# Patient Record
Sex: Female | Born: 1955 | Race: Black or African American | Hispanic: No | Marital: Single | Smoking: Current every day smoker
Health system: Southern US, Community
[De-identification: ages and names within clinical notes are randomized; demographics above are authoritative.]

## PROBLEM LIST (undated history)

## (undated) DIAGNOSIS — I10 Essential (primary) hypertension: Secondary | ICD-10-CM

## (undated) HISTORY — DX: Essential (primary) hypertension: I10

## (undated) HISTORY — PX: KNEE SURGERY: SHX244

## (undated) HISTORY — PX: TONSILLECTOMY: SUR1361

## (undated) HISTORY — PX: APPENDECTOMY: SHX54

---

## 2011-06-29 ENCOUNTER — Emergency Department (HOSPITAL_COMMUNITY): Payer: No Typology Code available for payment source

## 2011-06-29 ENCOUNTER — Emergency Department (HOSPITAL_COMMUNITY)
Admission: EM | Admit: 2011-06-29 | Discharge: 2011-06-29 | Disposition: A | Discharge status: Home / Self Care | Payer: No Typology Code available for payment source | Attending: Emergency Medicine | Admitting: Emergency Medicine

## 2011-06-29 DIAGNOSIS — T07XXXA Unspecified multiple injuries, initial encounter: Secondary | ICD-10-CM

## 2011-06-29 DIAGNOSIS — M545 Low back pain, unspecified: Secondary | ICD-10-CM | POA: Insufficient documentation

## 2011-06-29 DIAGNOSIS — M25569 Pain in unspecified knee: Secondary | ICD-10-CM | POA: Insufficient documentation

## 2011-06-29 DIAGNOSIS — F17200 Nicotine dependence, unspecified, uncomplicated: Secondary | ICD-10-CM | POA: Insufficient documentation

## 2011-06-29 DIAGNOSIS — R079 Chest pain, unspecified: Secondary | ICD-10-CM | POA: Insufficient documentation

## 2011-06-29 DIAGNOSIS — M542 Cervicalgia: Secondary | ICD-10-CM | POA: Insufficient documentation

## 2011-06-29 MED ORDER — METHOCARBAMOL 500 MG PO TABS: ORAL_TABLET | ORAL | Status: DC

## 2011-06-29 MED ORDER — HYDROCODONE-ACETAMINOPHEN 5-325 MG PO TABS
ORAL_TABLET | ORAL | Status: AC
Start: 2011-06-29 — End: 2011-07-09

## 2011-06-29 NOTE — ED Notes (Signed)
Pt called for treatment x 3 no answer.
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
## 2011-07-02 NOTE — ED Provider Notes (Signed)
Medical screening examination/treatment/procedure(s) were performed by non-physician practitioner and as supervising physician I was immediately available for consultation/collaboration.   Elwyn Lowden M Zevin Nevares, DO 07/02/11 1509 
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
## 2017-07-22 ENCOUNTER — Other Ambulatory Visit: Payer: Self-pay

## 2017-07-22 ENCOUNTER — Emergency Department (HOSPITAL_COMMUNITY): Payer: BLUE CROSS/BLUE SHIELD

## 2017-07-22 ENCOUNTER — Encounter (HOSPITAL_COMMUNITY): Payer: Self-pay | Admitting: *Deleted

## 2017-07-22 ENCOUNTER — Emergency Department (HOSPITAL_COMMUNITY)
Admission: EM | Admit: 2017-07-22 | Discharge: 2017-07-23 | Disposition: A | Discharge status: Home / Self Care | Payer: BLUE CROSS/BLUE SHIELD | Attending: Emergency Medicine | Admitting: Emergency Medicine

## 2017-07-22 DIAGNOSIS — R51 Headache: Secondary | ICD-10-CM | POA: Insufficient documentation

## 2017-07-22 DIAGNOSIS — R05 Cough: Secondary | ICD-10-CM | POA: Insufficient documentation

## 2017-07-22 DIAGNOSIS — R079 Chest pain, unspecified: Secondary | ICD-10-CM | POA: Insufficient documentation

## 2017-07-22 DIAGNOSIS — F17200 Nicotine dependence, unspecified, uncomplicated: Secondary | ICD-10-CM | POA: Diagnosis not present

## 2017-07-22 DIAGNOSIS — J029 Acute pharyngitis, unspecified: Secondary | ICD-10-CM | POA: Diagnosis present

## 2017-07-22 DIAGNOSIS — J3489 Other specified disorders of nose and nasal sinuses: Secondary | ICD-10-CM | POA: Diagnosis not present

## 2017-07-22 DIAGNOSIS — R0602 Shortness of breath: Secondary | ICD-10-CM | POA: Insufficient documentation

## 2017-07-22 DIAGNOSIS — J181 Lobar pneumonia, unspecified organism: Secondary | ICD-10-CM | POA: Diagnosis not present

## 2017-07-22 DIAGNOSIS — J189 Pneumonia, unspecified organism: Secondary | ICD-10-CM

## 2017-07-22 LAB — COMPREHENSIVE METABOLIC PANEL
ALK PHOS: 51 U/L (ref 38–126)
ALT: 19 U/L (ref 14–54)
ANION GAP: 15 (ref 5–15)
AST: 30 U/L (ref 15–41)
Albumin: 3.7 g/dL (ref 3.5–5.0)
BILIRUBIN TOTAL: 0.3 mg/dL (ref 0.3–1.2)
BUN: 15 mg/dL (ref 6–20)
CO2: 27 mmol/L (ref 22–32)
Calcium: 8.8 mg/dL — ABNORMAL LOW (ref 8.9–10.3)
Chloride: 99 mmol/L — ABNORMAL LOW (ref 101–111)
Creatinine, Ser: 0.7 mg/dL (ref 0.44–1.00)
GFR calc Af Amer: >60 mL/min (ref >60)
GFR calc non Af Amer: >60 mL/min (ref >60)
Glucose, Bld: 91 mg/dL (ref 65–99)
POTASSIUM: 4 mmol/L (ref 3.5–5.1)
SODIUM: 141 mmol/L (ref 135–145)
Total Protein: 7.7 g/dL (ref 6.5–8.1)

## 2017-07-22 LAB — URINALYSIS, ROUTINE W REFLEX MICROSCOPIC
BACTERIA UA: NONE SEEN
Bilirubin Urine: NEGATIVE
Glucose, UA: NEGATIVE mg/dL
KETONES UR: 20 mg/dL — AB
Leukocytes, UA: NEGATIVE
Nitrite: NEGATIVE
PROTEIN: 100 mg/dL — AB
Specific Gravity, Urine: 1.025 (ref 1.005–1.030)
pH: 5 (ref 5.0–8.0)

## 2017-07-22 LAB — CBC
HEMATOCRIT: 39.6 % (ref 36.0–46.0)
HEMOGLOBIN: 12.5 g/dL (ref 12.0–15.0)
MCH: 30.2 pg (ref 26.0–34.0)
MCHC: 31.6 g/dL (ref 30.0–36.0)
MCV: 95.7 fL (ref 78.0–100.0)
Platelets: 228 10*3/uL (ref 150–400)
RBC: 4.14 MIL/uL (ref 3.87–5.11)
RDW: 13.8 % (ref 11.5–15.5)
WBC: 6.8 10*3/uL (ref 4.0–10.5)

## 2017-07-22 LAB — TROPONIN I

## 2017-07-22 LAB — LIPASE, BLOOD: Lipase: 41 U/L (ref 11–51)

## 2017-07-22 MED ORDER — BENZONATATE 100 MG PO CAPS
100.0000 mg | ORAL_CAPSULE | Freq: Once | ORAL | Status: AC
  Administered 2017-07-22: 100 mg via ORAL
  Filled 2017-07-22: qty 1

## 2017-07-22 MED ORDER — DOXYCYCLINE HYCLATE 100 MG PO TABS
100.0000 mg | ORAL_TABLET | Freq: Once | ORAL | Status: AC
  Administered 2017-07-22: 100 mg via ORAL
  Filled 2017-07-22: qty 1

## 2017-07-22 MED ORDER — KETOROLAC TROMETHAMINE 60 MG/2ML IM SOLN
30.0000 mg | Freq: Once | INTRAMUSCULAR | Status: AC
  Administered 2017-07-22: 30 mg via INTRAMUSCULAR
  Filled 2017-07-22: qty 2

## 2017-07-22 MED ORDER — BENZONATATE 100 MG PO CAPS: 100.0000 mg | ORAL_CAPSULE | Freq: Three times a day (TID) | ORAL | 0 refills | Status: DC | PRN

## 2017-07-22 MED ORDER — DOXYCYCLINE HYCLATE 100 MG PO CAPS: 100.0000 mg | ORAL_CAPSULE | Freq: Two times a day (BID) | ORAL | 0 refills | Status: DC

## 2017-07-22 MED ORDER — DEXAMETHASONE 4 MG PO TABS
10.0000 mg | ORAL_TABLET | Freq: Once | ORAL | Status: AC
  Administered 2017-07-22: 10 mg via ORAL
  Filled 2017-07-22: qty 3

## 2017-07-22 NOTE — ED Triage Notes (Addendum)
Pt c/o LLQ abdominal pain x 3 weeks that radiates down bilateral legs and into back. Pt reports nausea, vomiting, diarrhea that started today.   Pt also c/o fever, body aches, headaches, sore throat, runny nose that started 07/16/17. Pt has taken Alka Seltzer Cold medicine with no relief.   Pt also c/o mid chest pain with no radiation x 2 days.

## 2017-07-23 NOTE — ED Provider Notes (Signed)
[facility]GENCY [facility]PARTMENT Provider Note   CSN: [id number] Arrival date & time: 07/22/17  1459     History   Chief Complaint Chief Complaint  Patient presents with  . Abdominal Pain  . Sore Throat    HPI Brianna Castro is a 62 y.o. female.   Sore Throat  Associated symptoms include chest pain, headaches and shortness of breath.  Cough  This is a new problem. The current episode started more than 1 week ago. The problem occurs constantly. The problem has been gradually worsening. The cough is productive of sputum. The maximum temperature recorded prior to her arrival was 100 to 100.9 F. Associated symptoms include chest pain, chills, sweats, ear congestion, headaches, rhinorrhea, sore throat, myalgias, shortness of breath and wheezing. She has tried nothing for the symptoms.    History reviewed. No pertinent past medical history.  There are no active problems to display for this patient.   Past Surgical History:  Procedure Laterality Date  . APPENDECTOMY    . KNEE SURGERY    . TONSILLECTOMY      OB History    No data available       Home Medications    Prior to Admission medications   Medication Sig Start Date End Date Taking? Authorizing Provider  benzonatate (TESSALON) 100 MG capsule Take 1 capsule (100 mg total) by mouth 3 (three) times daily as needed for cough. 07/22/17   Jonathyn Carothers, Barbara CowerJason, MD  doxycycline (VIBRAMYCIN) 100 MG capsule Take 1 capsule (100 mg total) by mouth 2 (two) times daily. One po bid x 7 days 07/22/17   Birdie Beveridge, Barbara CowerJason, MD  methocarbamol (ROBAXIN) 500 MG tablet Take 2 tabs po TID x 7 days 06/29/11   Pauline Ausriplett, Tammy, PA-C    Family History No family history on file.  Social History Social History   Tobacco Use  . Smoking status: Current Every Day Smoker    Packs/day: 0.50  . Smokeless tobacco: Never Used  Substance Use Topics  . Alcohol use: No  . Drug use: No     Allergies   Patient has no known allergies.   Review of  Systems Review of Systems  Constitutional: Positive for chills.  HENT: Positive for rhinorrhea and sore throat.   Respiratory: Positive for cough, shortness of breath and wheezing.   Cardiovascular: Positive for chest pain.  Musculoskeletal: Positive for myalgias.  Neurological: Positive for headaches.  All other systems reviewed and are negative.    Physical Exam Updated Vital Signs BP 110/72 (BP Location: Right Arm)   Pulse 72   Temp 98.6 F (37 C) (Oral)   Resp 19   Ht 5\' 2"  (1.575 m)   Wt 52.6 kg (116 lb)   SpO2 99%   BMI 21.22 kg/m   Physical Exam  Constitutional: She appears well-developed and well-nourished.  HENT:  Head: Normocephalic and atraumatic.  Eyes: EOM are normal. Pupils are equal, round, and reactive to light.  Neck: Normal range of motion.  Cardiovascular: Normal rate and regular rhythm.  Pulmonary/Chest: No stridor. Tachypnea noted. No respiratory distress. She has rales in the left lower field.  Abdominal: Normal appearance. She exhibits no distension.  Neurological: She is alert.  Skin: Skin is warm and dry.  Nursing note and vitals reviewed.    ED Treatments / Results  Labs (all labs ordered are listed, but only abnormal results are displayed) Labs Reviewed  COMPREHENSIVE METABOLIC PANEL - Abnormal; Notable for the following components:      Result  Value   Chloride 99 (*)    Calcium 8.8 (*)    All other components within normal limits  URINALYSIS, ROUTINE W REFLEX MICROSCOPIC - Abnormal; Notable for the following components:   Color, Urine AMBER (*)    APPearance HAZY (*)    Hgb urine dipstick SMALL (*)    Ketones, ur 20 (*)    Protein, ur 100 (*)    Squamous Epithelial / LPF 0-5 (*)    All other components within normal limits  LIPASE, BLOOD  CBC  TROPONIN I    EKG  EKG Interpretation None       Radiology Dg Chest 2 View  Result Date: 07/22/2017 CLINICAL DATA:  Cough and chest congestion. Chest pain and fever and shortness  of breath. EXAM: CHEST  2 VIEW COMPARISON:  06/29/2011 FINDINGS: The heart size and pulmonary vascularity are normal. There is peribronchial thickening with a small focal area of streaky atelectasis at the left base posterior medially. No lung consolidation. No effusions. No bone abnormality.  Aortic atherosclerosis. IMPRESSION: Bronchitic change with streaky atelectasis at the left lung base posterior medially. I recommend a follow-up chest x-ray in 6 weeks to ensure clearing. Electronically Signed   By: Francene Boyers M.D.   On: 07/22/2017 16:28    Procedures Procedures (including critical care time)  Medications Ordered in ED Medications  doxycycline (VIBRA-TABS) tablet 100 mg (100 mg Oral Given 07/22/17 2248)  benzonatate (TESSALON) capsule 100 mg (100 mg Oral Given 07/22/17 2249)  dexamethasone (DECADRON) tablet 10 mg (10 mg Oral Given 07/22/17 2248)  ketorolac (TORADOL) injection 30 mg (30 mg Intramuscular Given 07/22/17 2247)     Initial Impression / Assessment and Plan / ED Course  I have reviewed the triage vital signs and the nursing notes.  Pertinent labs & imaging results that were available during my care of the patient were reviewed by me and considered in my medical decision making (see chart for details).     Suspect pneumonia. Crackles in LLL and questionable consolidation on XR c/w same. With symptoms, not getting better, will start abx.   Final Clinical Impressions(s) / ED Diagnoses   Final diagnoses:  Pharyngitis, unspecified etiology  Community acquired pneumonia of left lower lobe of lung [facility])    ED Discharge Orders        Ordered    doxycycline (VIBRAMYCIN) 100 MG capsule  2 times daily     07/22/17 2353    benzonatate (TESSALON) 100 MG capsule  3 times daily PRN     07/22/17 2353       Angelmarie Ponzo, Barbara Cower, MD 07/23/17 [phone]

## 2018-11-29 IMAGING — DX DG CHEST 2V
2 series · 2 of 2 positions shown · non-contrast
Comparison: 06/29/2011

CLINICAL DATA: Cough and chest congestion. Chest pain and fever and
shortness of breath.

EXAM:
CHEST  2 VIEW

[chest pa]
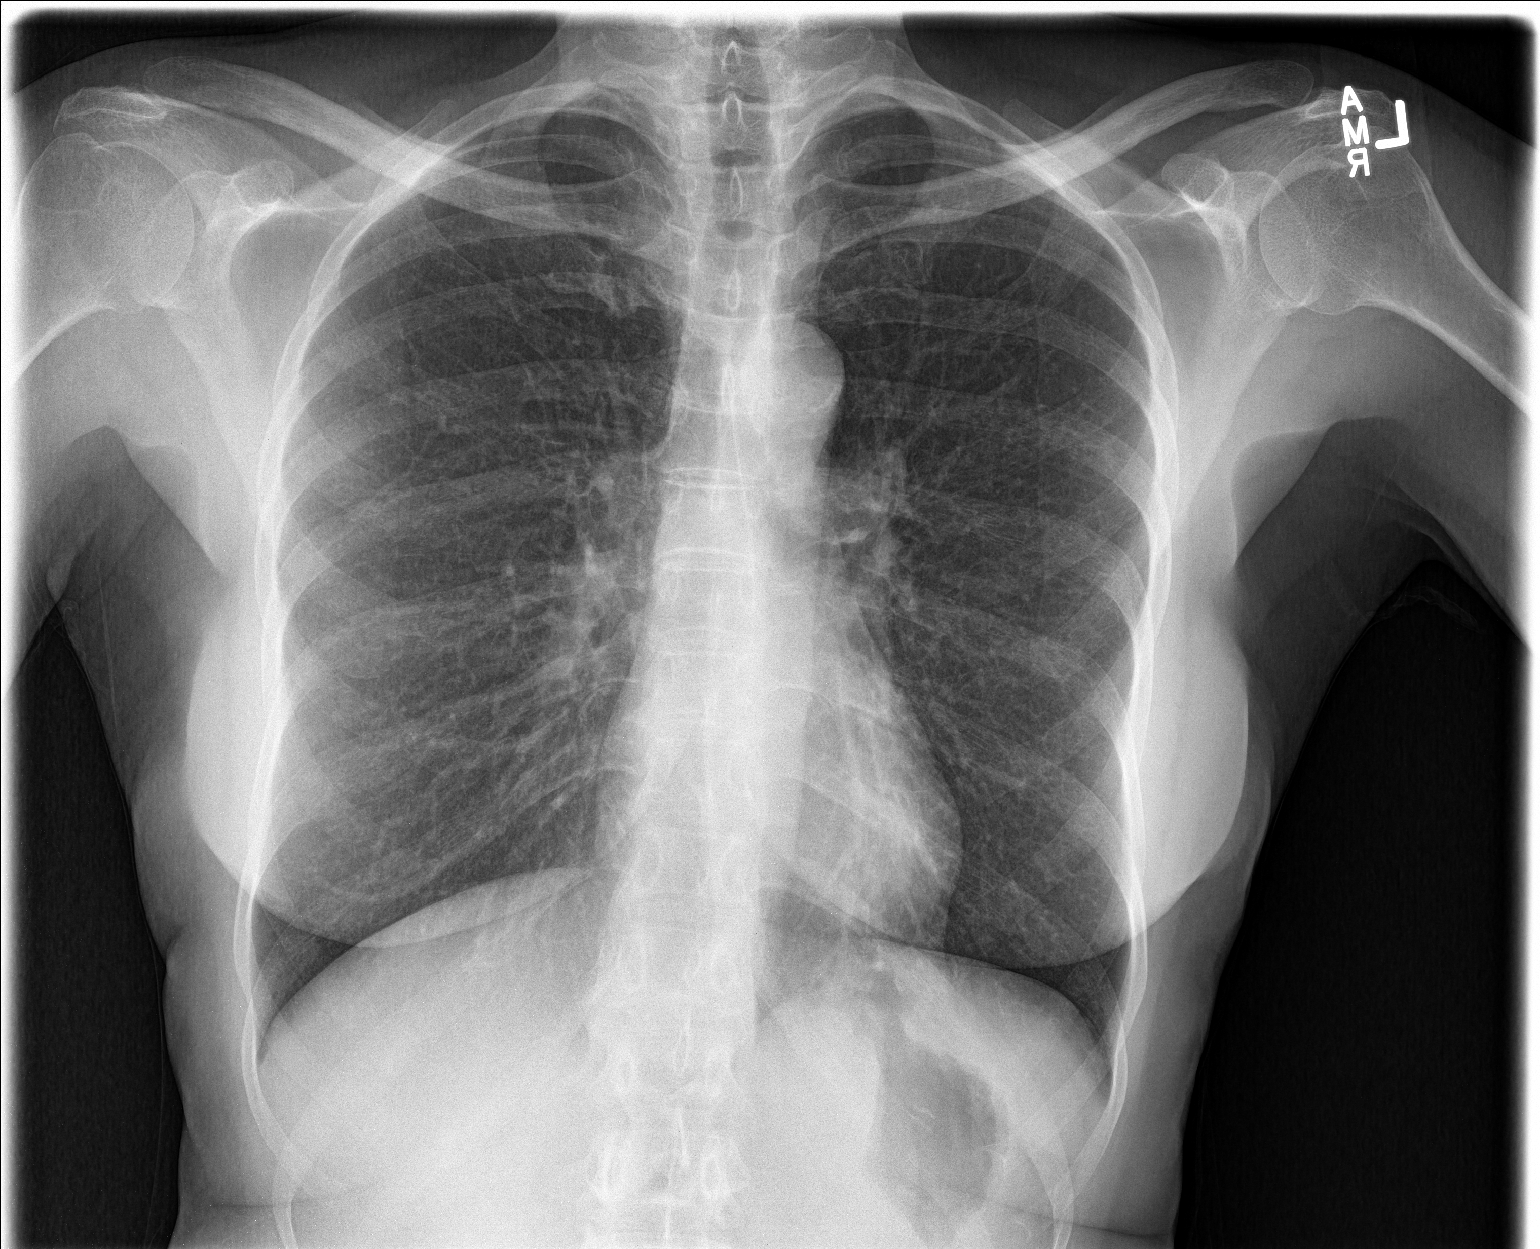

[chest lat]
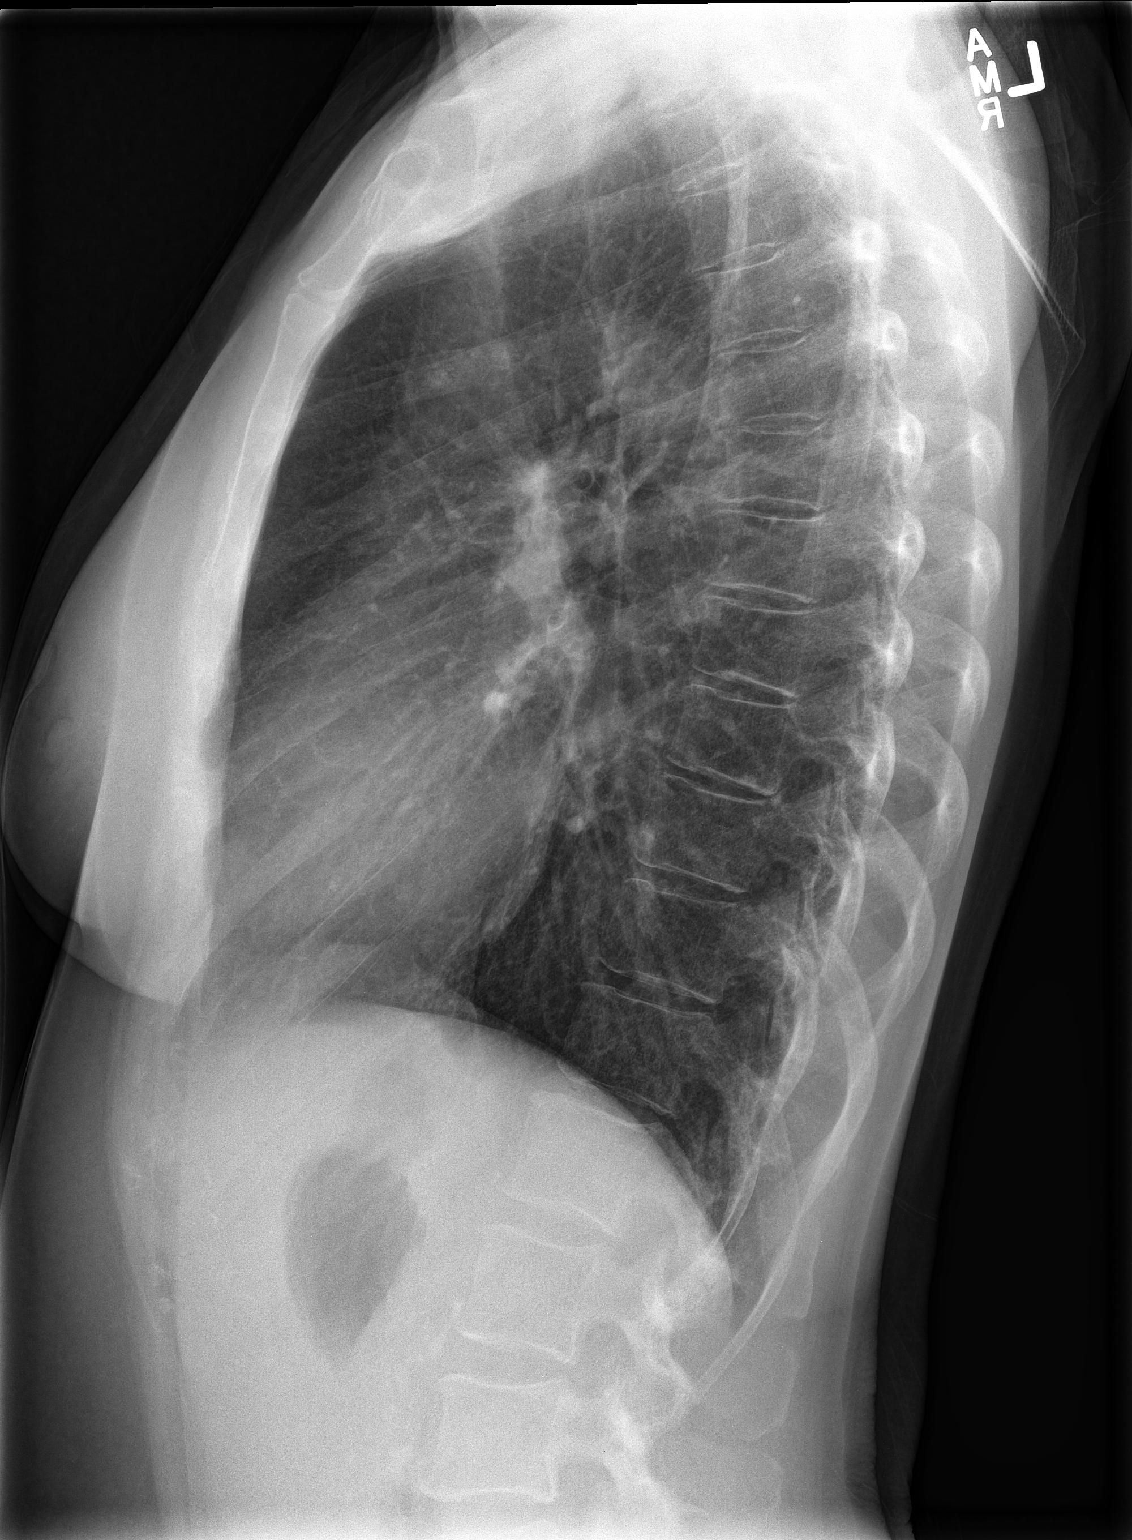

[2 of 2 positions shown; findings below may reference images not displayed]

FINDINGS: The heart size and pulmonary vascularity are normal. There is
peribronchial thickening with a small focal area of streaky
atelectasis at the left base posterior medially. No lung
consolidation. No effusions.

No bone abnormality.  Aortic atherosclerosis.
IMPRESSION: Bronchitic change with streaky atelectasis at the left lung base
posterior medially. I recommend a follow-up chest x-ray in 6 weeks
to ensure clearing.

## 2021-11-08 ENCOUNTER — Ambulatory Visit: Payer: BLUE CROSS/BLUE SHIELD | Admitting: Nurse Practitioner

## 2021-12-20 ENCOUNTER — Ambulatory Visit (INDEPENDENT_AMBULATORY_CARE_PROVIDER_SITE_OTHER): Payer: Medicare Other | Admitting: Family Medicine

## 2021-12-20 ENCOUNTER — Encounter: Payer: Self-pay | Admitting: Family Medicine

## 2021-12-20 VITALS — BP 132/80 | HR 76 | Ht 62.0 in | Wt 115.8 lb

## 2021-12-20 DIAGNOSIS — Z114 Encounter for screening for human immunodeficiency virus [HIV]: Secondary | ICD-10-CM

## 2021-12-20 DIAGNOSIS — Z0001 Encounter for general adult medical examination with abnormal findings: Secondary | ICD-10-CM

## 2021-12-20 DIAGNOSIS — Z1159 Encounter for screening for other viral diseases: Secondary | ICD-10-CM | POA: Diagnosis not present

## 2021-12-20 DIAGNOSIS — E559 Vitamin D deficiency, unspecified: Secondary | ICD-10-CM | POA: Diagnosis not present

## 2021-12-20 DIAGNOSIS — R7301 Impaired fasting glucose: Secondary | ICD-10-CM | POA: Diagnosis not present

## 2021-12-20 DIAGNOSIS — Z1211 Encounter for screening for malignant neoplasm of colon: Secondary | ICD-10-CM

## 2021-12-20 DIAGNOSIS — Z13820 Encounter for screening for osteoporosis: Secondary | ICD-10-CM | POA: Diagnosis not present

## 2021-12-20 DIAGNOSIS — Z23 Encounter for immunization: Secondary | ICD-10-CM | POA: Diagnosis not present

## 2021-12-20 DIAGNOSIS — Z1231 Encounter for screening mammogram for malignant neoplasm of breast: Secondary | ICD-10-CM | POA: Diagnosis not present

## 2021-12-20 NOTE — Progress Notes (Signed)
pa

## 2021-12-20 NOTE — Assessment & Plan Note (Signed)
-   physical exam performed -pending labs -mammogram ordered -dexa scan ordered

## 2021-12-20 NOTE — Patient Instructions (Addendum)
I appreciate the opportunity to provide care to you today!    Follow up:  4 months  Labs: please stop by the lab today to get your blood drawn (CBC, CMP, TSH, Lipid profile, HgA1c, Vit D)  Screening: HIV and Hep C  Please stop by [facility] hospital anytime to get an x-ray of your bones to assess for osteoporosis  and your mammogram  Thank you for getting your Tdap and Shingles vaccine    Please continue to a heart-healthy diet and increase your physical activities. Try to exercise for at least three times a week.      It was a pleasure to see you and I look forward to continuing to work together on your health and well-being. Please do not hesitate to call the office if you need care or have questions about your care.   Have a wonderful day and week. With Gratitude, Gilmore Laroche MSN, FNP-BC

## 2021-12-20 NOTE — Progress Notes (Signed)
o  New Patient Office Visit  Subjective:  Patient ID: Brianna Castro, female    DOB: 12-Oct-1955  Age: 66 y.o. MRN: [id number]  CC:  Chief Complaint  Patient presents with   New Patient (Initial Visit)    Pt establishing care, never had a PCP. Due for pap smear.     HPI Brianna Castro is a 66 y.o. female with  presents for establishing care. She has no concerns or complaints.  History reviewed. No pertinent past medical history.  Past Surgical History:  Procedure Laterality Date   APPENDECTOMY     KNEE SURGERY     TONSILLECTOMY      History reviewed. No pertinent family history.  Social History   Socioeconomic History   Marital status: Married    Spouse name: Not on file   Number of children: Not on file   Years of education: Not on file   Highest education level: Not on file  Occupational History   Not on file  Tobacco Use   Smoking status: Every Day    Packs/day: 0.50    Types: Cigarettes   Smokeless tobacco: Never  Substance and Sexual Activity   Alcohol use: No   Drug use: No   Sexual activity: Not on file  Other Topics Concern   Not on file  Social History Narrative   Not on file   Social Determinants of Health   Financial Resource Strain: Not on file  Food Insecurity: Not on file  Transportation Needs: Not on file  Physical Activity: Not on file  Stress: Not on file  Social Connections: Not on file  Intimate Partner Violence: Not on file    ROS Review of Systems  Constitutional:  Negative for chills, fatigue and fever.  HENT:  Negative for congestion, rhinorrhea, sinus pressure, sinus pain, sneezing and sore throat.   Eyes:  Negative for pain, redness and itching.  Respiratory:  Negative for chest tightness, shortness of breath and wheezing.   Cardiovascular:  Negative for chest pain, palpitations and leg swelling.  Gastrointestinal:  Negative for constipation, diarrhea, nausea and vomiting.  Endocrine: Negative for polydipsia, polyphagia and  polyuria.  Genitourinary:  Negative for dysuria, enuresis and urgency.  Musculoskeletal:  Negative for back pain and neck pain.  Skin:  Negative for rash and wound.  Neurological:  Negative for dizziness, tremors, weakness and numbness.  Psychiatric/Behavioral:  Negative for confusion, self-injury, sleep disturbance and suicidal ideas.    Objective:   Today's Vitals: BP 132/80   Pulse 76   Ht 5' 2"  (1.575 m)   Wt 115 lb 12.8 oz (52.5 kg)   SpO2 98%   BMI 21.18 kg/m   Physical Exam HENT:     Head: Normocephalic.     Left Ear: External ear normal.     Nose: No congestion.     Mouth/Throat:     Mouth: Mucous membranes are moist.     Dentition: Has dentures.  Eyes:     Extraocular Movements: Extraocular movements intact.     Pupils: Pupils are equal, round, and reactive to light.  Cardiovascular:     Rate and Rhythm: Normal rate and regular rhythm.     Pulses: Normal pulses.     Heart sounds: Normal heart sounds.  Pulmonary:     Effort: Pulmonary effort is normal.     Breath sounds: Normal breath sounds.  Abdominal:     Palpations: Abdomen is soft.  Musculoskeletal:     Cervical back: No rigidity.  Right lower leg: No edema.     Left lower leg: No edema.  Skin:    Findings: No lesion or rash.  Neurological:     Mental Status: She is alert and oriented to person, place, and time.     Comments: Normal affect    Assessment & Plan:   Problem List Items Addressed This Visit       Other   Encounter for general adult medical examination with abnormal findings    - physical exam performed -pending labs -mammogram ordered -dexa scan ordered       Relevant Orders   CBC with Differential/Platelet   CMP14+EGFR   Lipid panel   TSH + free T4   Other Visit Diagnoses     Colon cancer screening    -  Primary   Relevant Orders   Cologuard   Breast cancer screening by mammogram       Relevant Orders   MM 3D SCREEN BREAST BILATERAL   Need for shingles vaccine        Relevant Orders   Varicella-zoster vaccine IM (Shingrix) (Completed)   Need for Tdap vaccination       Relevant Orders   Tdap vaccine greater than or equal to 7yo IM (Completed)   Osteoporosis screening       Relevant Orders   DG Bone Density   Need for hepatitis C screening test       Relevant Orders   Hepatitis C antibody   Encounter for screening for HIV       Relevant Orders   HIV antibody (with reflex)   Vitamin D deficiency       Relevant Orders   Vitamin D (25 hydroxy)   IFG (impaired fasting glucose)       Relevant Orders   Hemoglobin A1C       Outpatient Encounter Medications as of 12/20/2021  Medication Sig   benzonatate (TESSALON) 100 MG capsule Take 1 capsule (100 mg total) by mouth 3 (three) times daily as needed for cough. (Patient not taking: Reported on 12/20/2021)   doxycycline (VIBRAMYCIN) 100 MG capsule Take 1 capsule (100 mg total) by mouth 2 (two) times daily. One po bid x 7 days (Patient not taking: Reported on 12/20/2021)   methocarbamol (ROBAXIN) 500 MG tablet Take 2 tabs po TID x 7 days (Patient not taking: Reported on 12/20/2021)   No facility-administered encounter medications on file as of 12/20/2021.    Follow-up: Return in about 4 months (around 04/21/2022).   Alvira Monday, FNP

## 2021-12-26 ENCOUNTER — Ambulatory Visit (HOSPITAL_COMMUNITY)
Admission: RE | Admit: 2021-12-26 | Discharge: 2021-12-26 | Disposition: A | Discharge status: Home / Self Care | Payer: Medicare Other | Source: Ambulatory Visit | Attending: Family Medicine | Admitting: Family Medicine

## 2021-12-26 DIAGNOSIS — M8589 Other specified disorders of bone density and structure, multiple sites: Secondary | ICD-10-CM | POA: Insufficient documentation

## 2021-12-26 DIAGNOSIS — R7301 Impaired fasting glucose: Secondary | ICD-10-CM | POA: Diagnosis not present

## 2021-12-26 DIAGNOSIS — Z0001 Encounter for general adult medical examination with abnormal findings: Secondary | ICD-10-CM | POA: Diagnosis not present

## 2021-12-26 DIAGNOSIS — F17200 Nicotine dependence, unspecified, uncomplicated: Secondary | ICD-10-CM | POA: Diagnosis not present

## 2021-12-26 DIAGNOSIS — Z1159 Encounter for screening for other viral diseases: Secondary | ICD-10-CM | POA: Diagnosis not present

## 2021-12-26 DIAGNOSIS — Z1231 Encounter for screening mammogram for malignant neoplasm of breast: Secondary | ICD-10-CM | POA: Insufficient documentation

## 2021-12-26 DIAGNOSIS — Z13820 Encounter for screening for osteoporosis: Secondary | ICD-10-CM | POA: Insufficient documentation

## 2021-12-26 DIAGNOSIS — E559 Vitamin D deficiency, unspecified: Secondary | ICD-10-CM | POA: Diagnosis not present

## 2021-12-26 DIAGNOSIS — Z780 Asymptomatic menopausal state: Secondary | ICD-10-CM | POA: Insufficient documentation

## 2021-12-27 LAB — CBC WITH DIFFERENTIAL/PLATELET
Basophils Absolute: 0.1 10*3/uL (ref 0.0–0.2)
Basos: 1 %
EOS (ABSOLUTE): 0.1 10*3/uL (ref 0.0–0.4)
Eos: 2 %
Hematocrit: 36.5 % (ref 34.0–46.6)
Hemoglobin: 12.5 g/dL (ref 11.1–15.9)
Immature Grans (Abs): 0 10*3/uL (ref 0.0–0.1)
Immature Granulocytes: 0 %
Lymphocytes Absolute: 3.8 10*3/uL — ABNORMAL HIGH (ref 0.7–3.1)
Lymphs: 54 %
MCH: 31.5 pg (ref 26.6–33.0)
MCHC: 34.2 g/dL (ref 31.5–35.7)
MCV: 92 fL (ref 79–97)
Monocytes Absolute: 0.5 10*3/uL (ref 0.1–0.9)
Monocytes: 7 %
Neutrophils Absolute: 2.5 10*3/uL (ref 1.4–7.0)
Neutrophils: 36 %
Platelets: 329 10*3/uL (ref 150–450)
RBC: 3.97 x10E6/uL (ref 3.77–5.28)
RDW: 13 % (ref 11.7–15.4)
WBC: 7.1 10*3/uL (ref 3.4–10.8)

## 2021-12-27 LAB — CMP14+EGFR
ALT: 12 IU/L (ref 0–32)
AST: 18 IU/L (ref 0–40)
Albumin/Globulin Ratio: 1.5 (ref 1.2–2.2)
Albumin: 4.3 g/dL (ref 3.8–4.8)
Alkaline Phosphatase: 87 IU/L (ref 44–121)
BUN/Creatinine Ratio: 10 — ABNORMAL LOW (ref 12–28)
BUN: 6 mg/dL — ABNORMAL LOW (ref 8–27)
Bilirubin Total: 0.3 mg/dL (ref 0.0–1.2)
CO2: 23 mmol/L (ref 20–29)
Calcium: 9.4 mg/dL (ref 8.7–10.3)
Chloride: 102 mmol/L (ref 96–106)
Creatinine, Ser: 0.58 mg/dL (ref 0.57–1.00)
Globulin, Total: 2.9 g/dL (ref 1.5–4.5)
Glucose: 93 mg/dL (ref 70–99)
Potassium: 4.8 mmol/L (ref 3.5–5.2)
Sodium: 138 mmol/L (ref 134–144)
Total Protein: 7.2 g/dL (ref 6.0–8.5)
eGFR: 100 mL/min/{1.73_m2} (ref >59)

## 2021-12-27 LAB — HEMOGLOBIN A1C
Est. average glucose Bld gHb Est-mCnc: 120 mg/dL
Hgb A1c MFr Bld: 5.8 % — ABNORMAL HIGH (ref 4.8–5.6)

## 2021-12-27 LAB — TSH+FREE T4
Free T4: 1.08 ng/dL (ref 0.82–1.77)
TSH: 0.794 u[IU]/mL (ref 0.450–4.500)

## 2021-12-27 LAB — HEPATITIS C ANTIBODY: Hep C Virus Ab: NONREACTIVE

## 2021-12-27 LAB — LIPID PANEL
Chol/HDL Ratio: 4.4 ratio (ref 0.0–4.4)
Cholesterol, Total: 207 mg/dL — ABNORMAL HIGH (ref 100–199)
HDL: 47 mg/dL (ref >39)
LDL Chol Calc (NIH): 148 mg/dL — ABNORMAL HIGH (ref 0–99)
Triglycerides: 69 mg/dL (ref 0–149)
VLDL Cholesterol Cal: 12 mg/dL (ref 5–40)

## 2021-12-27 LAB — VITAMIN D 25 HYDROXY (VIT D DEFICIENCY, FRACTURES): Vit D, 25-Hydroxy: 10.5 ng/mL — ABNORMAL LOW (ref 30.0–100.0)

## 2021-12-27 LAB — HIV ANTIBODY (ROUTINE TESTING W REFLEX): HIV Screen 4th Generation wRfx: NONREACTIVE

## 2021-12-31 ENCOUNTER — Other Ambulatory Visit: Payer: Self-pay | Admitting: Family Medicine

## 2021-12-31 ENCOUNTER — Inpatient Hospital Stay
Admission: RE | Admit: 2021-12-31 | Discharge: 2021-12-31 | Disposition: A | Discharge status: Home / Self Care | Payer: Self-pay | Source: Ambulatory Visit | Attending: Family Medicine | Admitting: Family Medicine

## 2021-12-31 DIAGNOSIS — E785 Hyperlipidemia, unspecified: Secondary | ICD-10-CM

## 2021-12-31 DIAGNOSIS — Z1231 Encounter for screening mammogram for malignant neoplasm of breast: Secondary | ICD-10-CM

## 2021-12-31 MED ORDER — VITAMIN D (ERGOCALCIFEROL) 1.25 MG (50000 UNIT) PO CAPS: 50000.0000 [IU] | ORAL_CAPSULE | ORAL | 1 refills | Status: DC

## 2021-12-31 MED ORDER — ROSUVASTATIN CALCIUM 10 MG PO TABS: 10.0000 mg | ORAL_TABLET | Freq: Every day | ORAL | 3 refills | Status: DC

## 2021-12-31 NOTE — Progress Notes (Signed)
Please, inform the patient that the Dexa Scan shows that sh osteopenic. I recommend OTC Calcium 1200mg  daily  with her once weekly Vit D supplement for bone health.

## 2021-12-31 NOTE — Progress Notes (Signed)
Please inform the patient that her cholesterol is elevated and her vit. D is low. She also has prediabetes. I recommend low carbs, sugar, and fat diet.  She can pick up her prescription for Rosuvastatin and once weekly Vit. D supplement at her pharmacy.

## 2021-12-31 NOTE — Progress Notes (Signed)
The 10-year ASCVD risk score (Arnett DK, et al., 2019) is: 17.3%   Values used to calculate the score:     Age: 66 years     Sex: Female     Is Non-Hispanic African American: Yes     Diabetic: No     Tobacco smoker: Yes     Systolic Blood Pressure: Q000111Q mmHg     Is BP treated: No     HDL Cholesterol: 47 mg/dL     Total Cholesterol: 207 mg/dL

## 2021-12-31 NOTE — Progress Notes (Signed)
Please inform the patient that her mammogram was negative for malignancy.

## 2022-01-16 DIAGNOSIS — Z1211 Encounter for screening for malignant neoplasm of colon: Secondary | ICD-10-CM | POA: Diagnosis not present

## 2022-01-24 ENCOUNTER — Telehealth: Payer: Self-pay

## 2022-01-24 NOTE — Telephone Encounter (Signed)
Attempted to contact pt unable to leave message.  

## 2022-01-24 NOTE — Telephone Encounter (Signed)
Brianna Castro from [employer] called [phone] they have tried contact patient at [phone]  Case # [id number] unable to get in touch with the patient.  Received the cologuard but had no date or time on the collection

## 2022-01-27 ENCOUNTER — Ambulatory Visit (INDEPENDENT_AMBULATORY_CARE_PROVIDER_SITE_OTHER): Payer: Medicare Other | Admitting: Family Medicine

## 2022-01-27 ENCOUNTER — Telehealth: Payer: Self-pay | Admitting: Family Medicine

## 2022-01-27 ENCOUNTER — Encounter: Payer: Self-pay | Admitting: Family Medicine

## 2022-01-27 VITALS — BP 140/64 | HR 96 | Ht 62.0 in | Wt 119.4 lb

## 2022-01-27 DIAGNOSIS — Z720 Tobacco use: Secondary | ICD-10-CM

## 2022-01-27 DIAGNOSIS — Z0001 Encounter for general adult medical examination with abnormal findings: Secondary | ICD-10-CM

## 2022-01-27 DIAGNOSIS — R9431 Abnormal electrocardiogram [ECG] [EKG]: Secondary | ICD-10-CM

## 2022-01-27 DIAGNOSIS — Z0000 Encounter for general adult medical examination without abnormal findings: Secondary | ICD-10-CM

## 2022-01-27 NOTE — Patient Instructions (Addendum)
I appreciate the opportunity to provide care to you today!     Please continue to a heart-healthy diet and increase your physical activities. Try to exercise for 30mins at least three times a week.      It was a pleasure to see you and I look forward to continuing to work together on your health and well-being. Please do not hesitate to call the office if you need care or have questions about your care.   Have a wonderful day and week. With Gratitude, Khylen Riolo MSN, FNP-BC  

## 2022-01-27 NOTE — Progress Notes (Deleted)
medi

## 2022-01-27 NOTE — Progress Notes (Deleted)
Established Patient Office Visit  Subjective:  Patient ID: Brianna Castro, female    DOB: [birth year]  Age: 66 y.o. MRN: [id number]  CC:  Chief Complaint  Patient presents with   Annual Exam    Medicare Initial wellness visit. Pt states she missed a dose an doxycyline and would like a refill. Due for pap smear will have it done at follow up appt.     HPI Brianna Castro is a 66 y.o. female with past medical history of *** presents for f/u of *** chronic medical conditions.  History reviewed. No pertinent past medical history.  Past Surgical History:  Procedure Laterality Date   APPENDECTOMY     KNEE SURGERY     TONSILLECTOMY      History reviewed. No pertinent family history.  Social History   Socioeconomic History   Marital status: Married    Spouse name: Not on file   Number of children: Not on file   Years of education: Not on file   Highest education level: Not on file  Occupational History   Not on file  Tobacco Use   Smoking status: Every Day    Packs/day: 0.50    Types: Cigarettes   Smokeless tobacco: Never  Substance and Sexual Activity   Alcohol use: No   Drug use: No   Sexual activity: Not on file  Other Topics Concern   Not on file  Social History Narrative   Not on file   Social Determinants of Health   Financial Resource Strain: Not on file  Food Insecurity: Not on file  Transportation Needs: Not on file  Physical Activity: Not on file  Stress: Not on file  Social Connections: Not on file  Intimate Partner Violence: Not on file    Outpatient Medications Prior to Visit  Medication Sig Dispense Refill   benzonatate (TESSALON) 100 MG capsule Take 1 capsule (100 mg total) by mouth 3 (three) times daily as needed for cough. 21 capsule 0   doxycycline (VIBRAMYCIN) 100 MG capsule Take 1 capsule (100 mg total) by mouth 2 (two) times daily. One po bid x 7 days 14 capsule 0   methocarbamol (ROBAXIN) 500 MG tablet Take 2 tabs po TID x 7 days 42 tablet 0    rosuvastatin (CRESTOR) 10 MG tablet Take 1 tablet (10 mg total) by mouth daily. 90 tablet 3   Vitamin D, Ergocalciferol, (DRISDOL) 1.25 MG (50000 UNIT) CAPS capsule Take 1 capsule (50,000 Units total) by mouth every 7 (seven) days. 5 capsule 1   No facility-administered medications prior to visit.    No Known Allergies  ROS Review of Systems    Objective:    Physical Exam  BP 140/64 (BP Location: Left Arm)   Pulse 96   Ht 5' 2"  (1.575 m)   Wt 119 lb 6.4 oz (54.2 kg)   SpO2 96%   BMI 21.84 kg/m  Wt Readings from Last 3 Encounters:  01/27/22 119 lb 6.4 oz (54.2 kg)  12/20/21 115 lb 12.8 oz (52.5 kg)  07/22/17 116 lb (52.6 kg)    Lab Results  Component Value Date   TSH 0.794 12/26/2021   Lab Results  Component Value Date   WBC 7.1 12/26/2021   HGB 12.5 12/26/2021   HCT 36.5 12/26/2021   MCV 92 12/26/2021   PLT 329 12/26/2021   Lab Results  Component Value Date   NA 138 12/26/2021   K 4.8 12/26/2021   CO2 23 12/26/2021   GLUCOSE  93 12/26/2021   BUN 6 (L) 12/26/2021   CREATININE 0.58 12/26/2021   BILITOT 0.3 12/26/2021   ALKPHOS 87 12/26/2021   AST 18 12/26/2021   ALT 12 12/26/2021   PROT 7.2 12/26/2021   ALBUMIN 4.3 12/26/2021   CALCIUM 9.4 12/26/2021   ANIONGAP 15 07/22/2017   EGFR 100 12/26/2021   Lab Results  Component Value Date   CHOL 207 (H) 12/26/2021   Lab Results  Component Value Date   HDL 47 12/26/2021   Lab Results  Component Value Date   LDLCALC 148 (H) 12/26/2021   Lab Results  Component Value Date   TRIG 69 12/26/2021   Lab Results  Component Value Date   CHOLHDL 4.4 12/26/2021   Lab Results  Component Value Date   HGBA1C 5.8 (H) 12/26/2021      Assessment & Plan:   Problem List Items Addressed This Visit   None   No orders of the defined types were placed in this encounter.   Follow-up: No follow-ups on file.    Alvira Monday, FNP

## 2022-01-27 NOTE — Progress Notes (Addendum)
Subjective:    Brianna Castro is a 66 y.o. female who presents for a Welcome to Medicare exam.   Review of Systems  Cardiac Risk Factors include: none      Objective:    Today's Vitals   01/27/22 1510 01/27/22 1514 01/27/22 1549  BP: (!) 145/68 140/64   Pulse: 96    SpO2: 96%    Weight: 119 lb 6.4 oz (54.2 kg)    Height: 5\' 2"  (1.575 m)    PainSc: 6   7   PainLoc: Leg    Body mass index is 21.84 kg/m.  Medications Outpatient Encounter Medications as of 01/27/2022  Medication Sig   benzonatate (TESSALON) 100 MG capsule Take 1 capsule (100 mg total) by mouth 3 (three) times daily as needed for cough.   doxycycline (VIBRAMYCIN) 100 MG capsule Take 1 capsule (100 mg total) by mouth 2 (two) times daily. One po bid x 7 days   methocarbamol (ROBAXIN) 500 MG tablet Take 2 tabs po TID x 7 days   rosuvastatin (CRESTOR) 10 MG tablet Take 1 tablet (10 mg total) by mouth daily.   Vitamin D, Ergocalciferol, (DRISDOL) 1.25 MG (50000 UNIT) CAPS capsule Take 1 capsule (50,000 Units total) by mouth every 7 (seven) days.   No facility-administered encounter medications on file as of 01/27/2022.     History: Past Medical History:  Diagnosis Date   Hypertension    Past Surgical History:  Procedure Laterality Date   APPENDECTOMY     KNEE SURGERY     KNEE SURGERY Right    TONSILLECTOMY      Family History  Problem Relation Age of Onset   Stroke Mother    Heart attack Mother    COPD Sister    Stroke Brother    Social History   Occupational History   Not on file  Tobacco Use   Smoking status: Every Day    Packs/day: 0.50    Years: 40.00    Total pack years: 20.00    Types: Cigarettes   Smokeless tobacco: Never  Vaping Use   Vaping Use: Not on file  Substance and Sexual Activity   Alcohol use: No   Drug use: Never   Sexual activity: Not Currently    Tobacco Counseling Ready to quit: No Counseling given: Yes   Immunizations and Health Maintenance Immunization  History  Administered Date(s) Administered   Tdap 12/20/2021   Zoster Recombinat (Shingrix) 12/20/2021   Health Maintenance Due  Topic Date Due   COVID-19 Vaccine (1) Never done   Pneumonia Vaccine 78+ Years old (1 - PCV) Never done   PAP SMEAR-Modifier  Never done   COLONOSCOPY (Pts 45-84yrs Insurance coverage will need to be confirmed)  Never done    Activities of Daily Living    01/27/2022    3:45 PM  In your present state of health, do you have any difficulty performing the following activities:  Hearing? 0  Vision? 0  Difficulty concentrating or making decisions? 0  Walking or climbing stairs? 1  Comment right leg  Dressing or bathing? 0  Doing errands, shopping? 0  Preparing Food and eating ? N  Using the Toilet? N  In the past six months, have you accidently leaked urine? N  Do you have problems with loss of bowel control? N  Managing your Medications? N  Managing your Finances? N  Housekeeping or managing your Housekeeping? N    Physical Exam  (optional), or other factors deemed appropriate  based on the beneficiary's medical and social history and current clinical standards.  Advanced Directives: Does Patient Have a Medical Advance Directive?: No Would patient like information on creating a medical advance directive?: No - Patient declined    Assessment:    This is a routine wellness examination for this patient .   Vision/Hearing screen No results found.  Dietary issues and exercise activities discussed:  Current Exercise Habits: The patient does not participate in regular exercise at present, Exercise limited by: None identified   Goals      Blood Pressure < 140/90     Cut back on smoking, and watch diet more closely, especially salt intake.       Depression Screen    01/27/2022    3:38 PM 01/27/2022    3:11 PM 12/20/2021   10:58 AM  PHQ 2/9 Scores  PHQ - 2 Score 0 0 1  PHQ- 9 Score 0  1     Fall Risk    01/27/2022    3:45 PM  Fall Risk    Falls in the past year? 0  Number falls in past yr: 0  Injury with Fall? 0  Risk for fall due to : No Fall Risks  Follow up Falls evaluation completed    Cognitive Function:    01/27/2022    3:29 PM  MMSE - Mini Mental State Exam  Not completed: Unable to complete        01/27/2022    3:47 PM  6CIT Screen  What Year? 0 points  What month? 0 points  What time? 0 points  Count back from 20 0 points  Months in reverse 0 points  Repeat phrase 0 points  Total Score 0 points    Patient Care Team: Gilmore Laroche, FNP as PCP - General (Family Medicine)     Plan:   Initial wellness visit EKG completed with Nonspecific T-abnormality with no acute ischemia noted -she denies SOB, chest pain, and chest tightness -similar findings on previous EKG -likely her baseline I have personally reviewed and noted the following in the patient's chart:   Medical and social history Use of alcohol, tobacco or illicit drugs  Current medications and supplements Functional ability and status Nutritional status Physical activity Advanced directives List of other physicians Hospitalizations, surgeries, and ER visits in previous 12 months Vitals Screenings to include cognitive, depression, and falls Referrals and appointments  In addition, I have reviewed and discussed with patient certain preventive protocols, quality metrics, and best practice recommendations. A written personalized care plan for preventive services as well as general preventive health recommendations were provided to patient.    Gilmore Laroche, FNP

## 2022-01-28 NOTE — Telephone Encounter (Signed)
Spoke with [employer] they need specific time and date from pt, will keep trying pt to let her know she needs to call them and let them know this information.

## 2022-01-29 NOTE — Telephone Encounter (Signed)
Multiple attempts unable to get in touch with patient.

## 2022-02-27 LAB — COLOGUARD: COLOGUARD: NEGATIVE

## 2022-02-28 NOTE — Progress Notes (Signed)
Please inform the patient that his cologurad is negative for colon cancer.

## 2022-03-11 DIAGNOSIS — U071 COVID-19: Secondary | ICD-10-CM | POA: Diagnosis not present

## 2022-03-12 ENCOUNTER — Other Ambulatory Visit: Payer: Self-pay

## 2022-03-12 ENCOUNTER — Emergency Department (HOSPITAL_COMMUNITY)
Admission: EM | Admit: 2022-03-12 | Discharge: 2022-03-12 | Disposition: A | Discharge status: Home / Self Care | Payer: Medicare Other | Attending: Emergency Medicine | Admitting: Emergency Medicine

## 2022-03-12 ENCOUNTER — Emergency Department (HOSPITAL_COMMUNITY): Payer: Medicare Other

## 2022-03-12 ENCOUNTER — Encounter (HOSPITAL_COMMUNITY): Payer: Self-pay | Admitting: Emergency Medicine

## 2022-03-12 DIAGNOSIS — U071 COVID-19: Secondary | ICD-10-CM | POA: Diagnosis not present

## 2022-03-12 DIAGNOSIS — R0602 Shortness of breath: Secondary | ICD-10-CM | POA: Diagnosis not present

## 2022-03-12 DIAGNOSIS — Z7951 Long term (current) use of inhaled steroids: Secondary | ICD-10-CM | POA: Diagnosis not present

## 2022-03-12 DIAGNOSIS — R0603 Acute respiratory distress: Secondary | ICD-10-CM | POA: Diagnosis not present

## 2022-03-12 DIAGNOSIS — R064 Hyperventilation: Secondary | ICD-10-CM | POA: Diagnosis not present

## 2022-03-12 LAB — CBC WITH DIFFERENTIAL/PLATELET
Abs Immature Granulocytes: 0.02 10*3/uL (ref 0.00–0.07)
Basophils Absolute: 0 10*3/uL (ref 0.0–0.1)
Basophils Relative: 0 %
Eosinophils Absolute: 0 10*3/uL (ref 0.0–0.5)
Eosinophils Relative: 0 %
HCT: 38 % (ref 36.0–46.0)
Hemoglobin: 12.5 g/dL (ref 12.0–15.0)
Immature Granulocytes: 0 %
Lymphocytes Relative: 30 %
Lymphs Abs: 2.1 10*3/uL (ref 0.7–4.0)
MCH: 31 pg (ref 26.0–34.0)
MCHC: 32.9 g/dL (ref 30.0–36.0)
MCV: 94.3 fL (ref 80.0–100.0)
Monocytes Absolute: 0.9 10*3/uL (ref 0.1–1.0)
Monocytes Relative: 13 %
Neutro Abs: 3.9 10*3/uL (ref 1.7–7.7)
Neutrophils Relative %: 57 %
Platelets: 268 10*3/uL (ref 150–400)
RBC: 4.03 MIL/uL (ref 3.87–5.11)
RDW: 14.4 % (ref 11.5–15.5)
WBC: 7 10*3/uL (ref 4.0–10.5)
nRBC: 0 % (ref 0.0–0.2)

## 2022-03-12 LAB — BRAIN NATRIURETIC PEPTIDE: B Natriuretic Peptide: 6 pg/mL (ref 0.0–100.0)

## 2022-03-12 LAB — BASIC METABOLIC PANEL
Anion gap: 9 (ref 5–15)
BUN: 15 mg/dL (ref 8–23)
CO2: 24 mmol/L (ref 22–32)
Calcium: 8.6 mg/dL — ABNORMAL LOW (ref 8.9–10.3)
Chloride: 104 mmol/L (ref 98–111)
Creatinine, Ser: 0.84 mg/dL (ref 0.44–1.00)
GFR, Estimated: >60 mL/min (ref >60)
Glucose, Bld: 110 mg/dL — ABNORMAL HIGH (ref 70–99)
Potassium: 3.9 mmol/L (ref 3.5–5.1)
Sodium: 137 mmol/L (ref 135–145)

## 2022-03-12 LAB — TROPONIN I (HIGH SENSITIVITY)
Troponin I (High Sensitivity): 4 ng/L (ref <18)
Troponin I (High Sensitivity): 7 ng/L (ref <18)

## 2022-03-12 MED ORDER — AEROCHAMBER Z-STAT PLUS/MEDIUM MISC
1.0000 | Freq: Once | Status: AC
  Administered 2022-03-12: 1

## 2022-03-12 MED ORDER — IPRATROPIUM-ALBUTEROL 0.5-2.5 (3) MG/3ML IN SOLN
3.0000 mL | Freq: Once | RESPIRATORY_TRACT | Status: AC
  Administered 2022-03-12: 3 mL via RESPIRATORY_TRACT
  Filled 2022-03-12: qty 3

## 2022-03-12 MED ORDER — DEXAMETHASONE 1 MG/ML PO CONC
10.0000 mg | Freq: Every day | ORAL | Status: DC
  Filled 2022-03-12 (×3): qty 10

## 2022-03-12 MED ORDER — AEROCHAMBER PLUS FLO-VU MEDIUM MISC
1.0000 | Freq: Once | Status: DC
  Filled 2022-03-12: qty 1

## 2022-03-12 MED ORDER — ALBUTEROL SULFATE HFA 108 (90 BASE) MCG/ACT IN AERS
2.0000 | INHALATION_SPRAY | Freq: Once | RESPIRATORY_TRACT | Status: AC
  Administered 2022-03-12: 2 via RESPIRATORY_TRACT
  Filled 2022-03-12: qty 6.7

## 2022-03-12 MED ORDER — DEXAMETHASONE 10 MG/ML FOR PEDIATRIC ORAL USE
10.0000 mg | Freq: Once | INTRAMUSCULAR | Status: AC
  Administered 2022-03-12: 10 mg via ORAL
  Filled 2022-03-12: qty 1

## 2022-03-12 NOTE — ED Provider Notes (Signed)
[facility] EMERGENCY DEPARTMENT Provider Note   CSN: [id number] Arrival date & time: 03/12/22  1748     History  Chief Complaint  Patient presents with   Shortness of Breath    Brianna Castro is a 66 y.o. female.  Patient is a 66 year old female recently diagnosed with COVID-19 virus yesterday on 03/11/2022 after symptom onset on of generalized weakness, malaise, and subjective fevers on Friday, 03/07/2022.  Patient states she is otherwise been feeling well.  States she sprayed inside of her house with a spray that is supposed to kill bugs*.  Took a nap for an hour and then woke up and began eating grapes when she had acute onset shortness of breath and difficulty breathing.  Denies any choking sensation.  On EMS arrival patient was found to be hypoxic at 90% on room air.  Improved immediately on nasal cannula.  In route patient was reported to have had significant improvement of respiratory distress and was taken off oxygen.  Arrived on room air at 96%.  Patient admits to improvement of shortness of breath at this time however has tachypnea and dyspnea on exam.  The history is provided by the patient. No language interpreter was used.  Shortness of Breath Associated symptoms: no abdominal pain, no chest pain, no cough, no ear pain, no fever, no rash, no sore throat and no vomiting        Home Medications Prior to Admission medications   Medication Sig Start Date End Date Taking? Authorizing Provider  benzonatate (TESSALON) 100 MG capsule Take 1 capsule (100 mg total) by mouth 3 (three) times daily as needed for cough. 07/22/17   Mesner, Barbara Cower, MD  doxycycline (VIBRAMYCIN) 100 MG capsule Take 1 capsule (100 mg total) by mouth 2 (two) times daily. One po bid x 7 days 07/22/17   Mesner, Barbara Cower, MD  methocarbamol (ROBAXIN) 500 MG tablet Take 2 tabs po TID x 7 days 06/29/11   Triplett, Tammy, PA-C  rosuvastatin (CRESTOR) 10 MG tablet Take 1 tablet (10 mg total) by mouth daily. 12/31/21   Gilmore Laroche, FNP  Vitamin D, Ergocalciferol, (DRISDOL) 1.25 MG (50000 UNIT) CAPS capsule Take 1 capsule (50,000 Units total) by mouth every 7 (seven) days. 12/31/21   Gilmore Laroche, FNP      Allergies    Patient has no known allergies.    Review of Systems   Review of Systems  Constitutional:  Negative for chills and fever.  HENT:  Negative for ear pain and sore throat.   Eyes:  Negative for pain and visual disturbance.  Respiratory:  Positive for shortness of breath. Negative for cough.   Cardiovascular:  Negative for chest pain and palpitations.  Gastrointestinal:  Negative for abdominal pain and vomiting.  Genitourinary:  Negative for dysuria and hematuria.  Musculoskeletal:  Negative for arthralgias and back pain.  Skin:  Negative for color change and rash.  Neurological:  Negative for seizures and syncope.  All other systems reviewed and are negative.   Physical Exam Updated Vital Signs BP 98/73   Pulse 94   Temp 98 F (36.7 C) (Oral)   Resp 20   Ht 5\' 2"  (1.575 m)   Wt 55.8 kg   SpO2 97%   BMI 22.50 kg/m  Physical Exam Vitals and nursing note reviewed.  Constitutional:      General: She is not in acute distress.    Appearance: She is well-developed.  HENT:     Head: Normocephalic and atraumatic.  Eyes:  Conjunctiva/sclera: Conjunctivae normal.  Cardiovascular:     Rate and Rhythm: Normal rate and regular rhythm.     Heart sounds: No murmur heard. Pulmonary:     Effort: Pulmonary effort is normal. Tachypnea present. No respiratory distress.     Breath sounds: Normal breath sounds.  Abdominal:     Palpations: Abdomen is soft.     Tenderness: There is no abdominal tenderness.  Musculoskeletal:        General: No swelling.     Cervical back: Neck supple.  Skin:    General: Skin is warm and dry.     Capillary Refill: Capillary refill takes less than 2 seconds.  Neurological:     Mental Status: She is alert.  Psychiatric:        Mood and Affect: Mood  normal.     ED Results / Procedures / Treatments   Labs (all labs ordered are listed, but only abnormal results are displayed) Labs Reviewed  BASIC METABOLIC PANEL - Abnormal; Notable for the following components:      Result Value   Glucose, Bld 110 (*)    Calcium 8.6 (*)    All other components within normal limits  CBC WITH DIFFERENTIAL/PLATELET  BRAIN NATRIURETIC PEPTIDE  TROPONIN I (HIGH SENSITIVITY)  TROPONIN I (HIGH SENSITIVITY)    EKG EKG Interpretation  Date/Time:  Wednesday March 12 2022 17:57:35 EDT Ventricular Rate:  96 PR Interval:  128 QRS Duration: 73 QT Interval:  341 QTC Calculation: 431 R Axis:   78 Text Interpretation: Sinus rhythm Right atrial enlargement Confirmed by Edwin Dada ([id number]) on 03/12/2022 10:53:58 PM  Radiology DG Chest Portable 1 View  Result Date: 03/12/2022 CLINICAL DATA:  Shortness of breath. EXAM: PORTABLE CHEST 1 VIEW COMPARISON:  Chest radiograph dated 08/09/2017. FINDINGS: No focal consolidation, pleural effusion or pneumothorax. Mild diffuse chronic interstitial coarsening and nodularity. The cardiac silhouette is within normal limits. Atherosclerotic calcification of the aorta. No acute osseous pathology. IMPRESSION: No active disease. Electronically Signed   By: Elgie Collard M.D.   On: 03/12/2022 19:38    Procedures Procedures    Medications Ordered in ED Medications  albuterol (VENTOLIN HFA) 108 (90 Base) MCG/ACT inhaler 2 puff (has no administration in time range)  AeroChamber Plus Flo-Vu Medium MISC 1 each (has no administration in time range)  ipratropium-albuterol (DUONEB) 0.5-2.5 (3) MG/3ML nebulizer solution 3 mL (3 mLs Nebulization Given 03/12/22 2109)  dexamethasone (DECADRON) 10 MG/ML injection for Pediatric ORAL use 10 mg (10 mg Oral Given 03/12/22 2223)    ED Course/ Medical Decision Making/ A&P                           Medical Decision Making Amount and/or Complexity of Data Reviewed Labs:  ordered. Radiology: ordered.  Risk Prescription drug management.   29:76 PM 66 year old female recently diagnosed with COVID-19 virus yesterday on 03/11/2022 after symptom onset on of generalized weakness, malaise, and subjective fevers on Friday, 03/07/2022.  Patient states she is otherwise been feeling well.  Patient is alert and oriented x3, no acute distress, afebrile, stable vital signs.  On arrival from EMS patient is sitting up in bed, tachypneic but otherwise not hypoxic and breathing comfortably.  Patient given DuoNeb treatment and Decadron significant improvement of tachypnea.  Patient was visualized emergency department for several hours with stable cardiovascular work-up.  Safe for discharge home at this time with prompt return instructions for any worsening of symptoms.  Sent home  with albuterol inhaler.  Stable EKG with no ST segment elevation or depression.  Normal sinus rhythm.  Stable intervals.  Patient has stable electrolytes.  Troponin within normal limits.  Chest x-ray demonstrating no acute process.  No pneumonia.  No pneumothorax.  PE considered however patient is a well-appearing this time and has no acute complaints.  Patient symptoms likely secondary to COVID-19 virus versus chemical exposure in the home.  Patient in no distress and overall condition improved here in the ED. Detailed discussions were had with the patient regarding current findings, and need for close f/u with PCP or on call doctor. The patient has been instructed to return immediately if the symptoms worsen in any way for re-evaluation. Patient verbalized understanding and is in agreement with current care plan. All questions answered prior to discharge.         Final Clinical Impression(s) / ED Diagnoses Final diagnoses:  COVID  Respiratory distress    Rx / DC Orders ED Discharge Orders     None         Franne Forts, DO 03/12/22 2254

## 2022-03-12 NOTE — ED Triage Notes (Signed)
Pt arrived via [facility] from home c/o sob, pt recently tested positive from covid, Sx presents Sunday and tested positive for covid on Tuesday. Per [facility], lungs sounds are clear and pt was 90% on RA. Pt came up to 98% after oxygen. HR 100

## 2022-03-12 NOTE — ED Notes (Signed)
AC notified of missing dose of decadron

## 2022-04-21 ENCOUNTER — Ambulatory Visit: Payer: Medicare Other | Admitting: Family Medicine

## 2022-04-22 ENCOUNTER — Encounter: Payer: Self-pay | Admitting: Family Medicine

## 2022-04-22 ENCOUNTER — Ambulatory Visit (INDEPENDENT_AMBULATORY_CARE_PROVIDER_SITE_OTHER): Payer: Medicare Other | Admitting: Family Medicine

## 2022-04-22 VITALS — BP 118/76 | HR 87 | Ht 62.0 in | Wt 127.0 lb

## 2022-04-22 DIAGNOSIS — R7301 Impaired fasting glucose: Secondary | ICD-10-CM

## 2022-04-22 DIAGNOSIS — Z720 Tobacco use: Secondary | ICD-10-CM

## 2022-04-22 DIAGNOSIS — M1990 Unspecified osteoarthritis, unspecified site: Secondary | ICD-10-CM | POA: Diagnosis not present

## 2022-04-22 DIAGNOSIS — J302 Other seasonal allergic rhinitis: Secondary | ICD-10-CM

## 2022-04-22 DIAGNOSIS — J309 Allergic rhinitis, unspecified: Secondary | ICD-10-CM | POA: Insufficient documentation

## 2022-04-22 DIAGNOSIS — Z2821 Immunization not carried out because of patient refusal: Secondary | ICD-10-CM

## 2022-04-22 DIAGNOSIS — E559 Vitamin D deficiency, unspecified: Secondary | ICD-10-CM

## 2022-04-22 MED ORDER — GUAIFENESIN 100 MG/5ML PO LIQD: 5.0000 mL | ORAL | 0 refills | Status: DC | PRN

## 2022-04-22 MED ORDER — AZELASTINE HCL 0.1 % NA SOLN: 2.0000 | Freq: Two times a day (BID) | NASAL | 12 refills | Status: DC

## 2022-04-22 MED ORDER — LEVOCETIRIZINE DIHYDROCHLORIDE 5 MG PO TABS: 5.0000 mg | ORAL_TABLET | Freq: Every evening | ORAL | 0 refills | Status: DC

## 2022-04-22 MED ORDER — VITAMIN D (ERGOCALCIFEROL) 1.25 MG (50000 UNIT) PO CAPS: 50000.0000 [IU] | ORAL_CAPSULE | ORAL | 2 refills | Status: DC

## 2022-04-22 NOTE — Progress Notes (Signed)
Established Patient Office Visit  Subjective:  Patient ID: Brianna Castro, female    DOB: 1956-03-24  Age: 66 y.o. MRN: [id number]  CC:  Chief Complaint  Patient presents with   Follow-up    Following up, pt reports nasal congestion today. Pt c/o right leg pain, has been told in the past she has arthritis, but pain seems to be getting worse 4/10 today.     HPI Brianna Castro is a 66 y.o. female with past medical history of arthritis presents for f/u of  chronic medical conditions.  Arthritis: She complains of aching pain in the right leg with increased stiffness of her right leg.  She reports an inability to abduct and elevate the right leg while sitting.  She complains of pain with ambulation, reporting pain to be 4 out of 10 today. she denies any recent injury or trauma to the affected leg. She reports getting steroid injection a year ago with relief of symptoms. She also reports following up with physical therapy with minimal relief of her symptoms.   Allergic Rhinitis: She complains of nasal congestion,cough, sneezing, and body aches.  She denies fever, chills, sore throat,  sinus pain and sinus pressure.  The onset of symptoms was 3 days ago.  She reports not taking any medication as of yet.  Tobacco use: She smokes half a pack of cigarettes daily.  Past Medical History:  Diagnosis Date   Hypertension     Past Surgical History:  Procedure Laterality Date   APPENDECTOMY     KNEE SURGERY     KNEE SURGERY Right    TONSILLECTOMY      Family History  Problem Relation Age of Onset   Stroke Mother    Heart attack Mother    COPD Sister    Stroke Brother     Social History   Socioeconomic History   Marital status: Single    Spouse name: Not on file   Number of children: 1   Years of education: Associates Degree   Highest education level: Associate degree: occupational, Hotel manager, or vocational program  Occupational History   Not on file  Tobacco Use   Smoking status:  Every Day    Packs/day: 0.50    Years: 40.00    Total pack years: 20.00    Types: Cigarettes   Smokeless tobacco: Never  Vaping Use   Vaping Use: Not on file  Substance and Sexual Activity   Alcohol use: No   Drug use: Never   Sexual activity: Not Currently  Other Topics Concern   Not on file  Social History Narrative   Not on file   Social Determinants of Health   Financial Resource Strain: Low Risk  (01/27/2022)   Overall Financial Resource Strain (CARDIA)    Difficulty of Paying Living Expenses: Not hard at all  Food Insecurity: No Food Insecurity (01/27/2022)   Hunger Vital Sign    Worried About Running Out of Food in the Last Year: Never true    Ran Out of Food in the Last Year: Never true  Transportation Needs: No Transportation Needs (01/27/2022)   PRAPARE - Hydrologist (Medical): No    Lack of Transportation (Non-Medical): No  Physical Activity: Insufficiently Active (01/27/2022)   Exercise Vital Sign    Days of Exercise per Week: 2 days    Minutes of Exercise per Session: 10 min  Stress: No Stress Concern Present (01/27/2022)   Arjay -  Occupational Stress Questionnaire    Feeling of Stress : Not at all  Social Connections: Moderately Integrated (01/27/2022)   Social Connection and Isolation Panel [NHANES]    Frequency of Communication with Friends and Family: More than three times a week    Frequency of Social Gatherings with Friends and Family: More than three times a week    Attends Religious Services: More than 4 times per year    Active Member of [employer] or Organizations: No    Attends Music therapist: 1 to 4 times per year    Marital Status: Never married  Recent Concern: Social Connections - Moderately Isolated (01/27/2022)   Social Connection and Isolation Panel [NHANES]    Frequency of Communication with Friends and Family: More than three times a week    Frequency of Social  Gatherings with Friends and Family: More than three times a week    Attends Religious Services: More than 4 times per year    Active Member of [employer] or Organizations: No    Attends Archivist Meetings: Never    Marital Status: Never married  Intimate Partner Violence: Not At Risk (01/27/2022)   Humiliation, Afraid, Rape, and Kick questionnaire    Fear of Current or Ex-Partner: No    Emotionally Abused: No    Physically Abused: No    Sexually Abused: No    Outpatient Medications Prior to Visit  Medication Sig Dispense Refill   benzonatate (TESSALON) 100 MG capsule Take 1 capsule (100 mg total) by mouth 3 (three) times daily as needed for cough. 21 capsule 0   doxycycline (VIBRAMYCIN) 100 MG capsule Take 1 capsule (100 mg total) by mouth 2 (two) times daily. One po bid x 7 days 14 capsule 0   methocarbamol (ROBAXIN) 500 MG tablet Take 2 tabs po TID x 7 days 42 tablet 0   rosuvastatin (CRESTOR) 10 MG tablet Take 1 tablet (10 mg total) by mouth daily. (Patient not taking: Reported on 04/22/2022) 90 tablet 3   Vitamin D, Ergocalciferol, (DRISDOL) 1.25 MG (50000 UNIT) CAPS capsule Take 1 capsule (50,000 Units total) by mouth every 7 (seven) days. (Patient not taking: Reported on 04/22/2022) 5 capsule 1   No facility-administered medications prior to visit.    No Known Allergies  ROS Review of Systems  Constitutional:  Negative for chills, fatigue and fever.  HENT:  Positive for congestion and sneezing. Negative for rhinorrhea, sinus pressure, sinus pain and sore throat.   Eyes:  Negative for photophobia and visual disturbance.  Respiratory:  Positive for cough. Negative for chest tightness and shortness of breath.   Cardiovascular:  Negative for chest pain and palpitations.  Musculoskeletal:  Positive for arthralgias.  Neurological:  Negative for dizziness, numbness and headaches.      Objective:    Physical Exam HENT:     Head: Normocephalic.     Right Ear: External ear  normal.     Left Ear: External ear normal.     Nose: Congestion present.  Cardiovascular:     Rate and Rhythm: Normal rate and regular rhythm.     Pulses: Normal pulses.     Heart sounds: Normal heart sounds.  Pulmonary:     Effort: Pulmonary effort is normal.     Breath sounds: Normal breath sounds.  Musculoskeletal:     Right hip: No tenderness. Normal range of motion.     Left hip: No tenderness. Normal range of motion.     Right lower leg:  Tenderness present. No edema.     Left lower leg: No edema.     Comments: Decreased active and passive ROM  Neurological:     Mental Status: She is alert.     BP 118/76 (BP Location: Left Arm)   Pulse 87   Ht 5' 2"  (1.575 m)   Wt 127 lb 0.6 oz (57.6 kg)   SpO2 97%   BMI 23.24 kg/m  Wt Readings from Last 3 Encounters:  04/22/22 127 lb 0.6 oz (57.6 kg)  03/12/22 123 lb (55.8 kg)  01/27/22 119 lb 6.4 oz (54.2 kg)    Lab Results  Component Value Date   TSH 0.794 12/26/2021   Lab Results  Component Value Date   WBC 7.0 03/12/2022   HGB 12.5 03/12/2022   HCT 38.0 03/12/2022   MCV 94.3 03/12/2022   PLT 268 03/12/2022   Lab Results  Component Value Date   NA 137 03/12/2022   K 3.9 03/12/2022   CO2 24 03/12/2022   GLUCOSE 110 (H) 03/12/2022   BUN 15 03/12/2022   CREATININE 0.84 03/12/2022   BILITOT 0.3 12/26/2021   ALKPHOS 87 12/26/2021   AST 18 12/26/2021   ALT 12 12/26/2021   PROT 7.2 12/26/2021   ALBUMIN 4.3 12/26/2021   CALCIUM 8.6 (L) 03/12/2022   ANIONGAP 9 03/12/2022   EGFR 100 12/26/2021   Lab Results  Component Value Date   CHOL 207 (H) 12/26/2021   Lab Results  Component Value Date   HDL 47 12/26/2021   Lab Results  Component Value Date   LDLCALC 148 (H) 12/26/2021   Lab Results  Component Value Date   TRIG 69 12/26/2021   Lab Results  Component Value Date   CHOLHDL 4.4 12/26/2021   Lab Results  Component Value Date   HGBA1C 5.8 (H) 12/26/2021      Assessment & Plan:   Problem List  Items Addressed This Visit       Respiratory   Allergic rhinitis    Will treat with Xyzal 5 mg at bedtime Azelastine nasal spray order Robitussin order for cough      Relevant Medications   levocetirizine (XYZAL) 5 MG tablet   azelastine (ASTELIN) 0.1 % nasal spray   guaiFENesin (ROBITUSSIN) 100 MG/5ML liquid     Musculoskeletal and Integument   Arthritis - Primary    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      Relevant Orders   Ambulatory referral to Orthopedic Surgery     Other   Tobacco use    Smokes about 1/2 pack/day  Asked about quitting: confirms that she currently smokes cigarettes Advise to quit smoking: Educated about QUITTING to reduce the risk of cancer, cardio and cerebrovascular disease. Assess willingness: Unwilling to quit at this time, but is working on cutting back. Assist with counseling and pharmacotherapy: Counseled for 5 minutes and literature provided. Arrange for follow up: follow up in 3 months and continue to offer help.      Other Visit Diagnoses     Refused influenza vaccine       Refused pneumococcal vaccine       Vitamin D deficiency  Relevant Orders   Vitamin D (25 hydroxy)   IFG (impaired fasting glucose)       Relevant Orders   CBC with Differential/Platelet   CMP14+EGFR   Hemoglobin A1C   Lipid Profile   TSH + free T4       Meds ordered this encounter  Medications   levocetirizine (XYZAL) 5 MG tablet    Sig: Take 1 tablet (5 mg total) by mouth every evening.    Dispense:  20 tablet    Refill:  0   azelastine (ASTELIN) 0.1 % nasal spray    Sig: Place 2 sprays into both nostrils 2 (two)  times daily. Use in each nostril as directed    Dispense:  30 mL    Refill:  12   guaiFENesin (ROBITUSSIN) 100 MG/5ML liquid    Sig: Take 5 mLs by mouth every 4 (four) hours as needed for cough or to loosen phlegm.    Dispense:  120 mL    Refill:  0   Vitamin D, Ergocalciferol, (DRISDOL) 1.25 MG (50000 UNIT) CAPS capsule    Sig: Take 1 capsule (50,000 Units total) by mouth every 7 (seven) days.    Dispense:  5 capsule    Refill:  2    Follow-up: Return in about 3 months (around 07/23/2022).    Alvira Monday, FNP

## 2022-04-22 NOTE — Assessment & Plan Note (Signed)
Will treat with Xyzal 5 mg at bedtime Azelastine nasal spray order Robitussin order for cough

## 2022-04-22 NOTE — Patient Instructions (Signed)
I appreciate the opportunity to provide care to you today!    Follow up:  3 months  Fasting labs: please stop by the lab today to get your blood drawn (CBC, CMP, TSH, Lipid profile, HgA1c, Vit D)  Referral: orthopedics surgery   Please continue to a heart-healthy diet and increase your physical activities. Try to exercise for 32mins at least three times a week.      It was a pleasure to see you and I look forward to continuing to work together on your health and well-being. Please do not hesitate to call the office if you need care or have questions about your care.   Have a wonderful day and week. With Gratitude, Alvira Monday MSN, FNP-BC

## 2022-04-22 NOTE — Assessment & Plan Note (Signed)
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

## 2022-04-22 NOTE — Assessment & Plan Note (Signed)
Smokes about 1/2 pack/day  Asked about quitting: confirms that she currently smokes cigarettes Advise to quit smoking: Educated about QUITTING to reduce the risk of cancer, cardio and cerebrovascular disease. Assess willingness: Unwilling to quit at this time, but is working on cutting back. Assist with counseling and pharmacotherapy: Counseled for 5 minutes and literature provided. Arrange for follow up: follow up in 3 months and continue to offer help. 

## 2022-05-06 ENCOUNTER — Ambulatory Visit: Payer: Medicare Other | Admitting: Orthopedic Surgery

## 2022-05-06 ENCOUNTER — Ambulatory Visit (INDEPENDENT_AMBULATORY_CARE_PROVIDER_SITE_OTHER): Payer: Medicare Other

## 2022-05-06 ENCOUNTER — Encounter: Payer: Self-pay | Admitting: Orthopedic Surgery

## 2022-05-06 VITALS — BP 144/88 | HR 89 | Ht 62.0 in | Wt 134.0 lb

## 2022-05-06 DIAGNOSIS — R7301 Impaired fasting glucose: Secondary | ICD-10-CM | POA: Diagnosis not present

## 2022-05-06 DIAGNOSIS — M1611 Unilateral primary osteoarthritis, right hip: Secondary | ICD-10-CM | POA: Diagnosis not present

## 2022-05-06 DIAGNOSIS — M79604 Pain in right leg: Secondary | ICD-10-CM | POA: Diagnosis not present

## 2022-05-06 DIAGNOSIS — E559 Vitamin D deficiency, unspecified: Secondary | ICD-10-CM | POA: Diagnosis not present

## 2022-05-06 DIAGNOSIS — R7303 Prediabetes: Secondary | ICD-10-CM | POA: Diagnosis not present

## 2022-05-06 MED ORDER — CELECOXIB 100 MG PO CAPS: 100.0000 mg | ORAL_CAPSULE | Freq: Two times a day (BID) | ORAL | 0 refills | Status: AC

## 2022-05-06 NOTE — Progress Notes (Signed)
New Patient Visit  Assessment: Brianna Castro is a 66 y.o. female with the following: Right hip arthritis  Plan: Kenyona Rena has degenerative changes of both hips.  Asymptomatic on the left.  We reviewed radiographs in clinic today, specifically of the right hip.  She does have some degenerative changes, as well as some prominent osteophytes.  Her current presentation does not consistent with hip arthritis, but cannot explain some of her symptoms.  I think it is important for Brianna Castro to treat her current pain as arthritis and then evaluate for improvements.  We discussed multiple treatment options including physical therapy.  She states she has tried therapy in the past and is not interested.  We could also try medications or an injection.  She would like to wait to proceed with the injection.  She is interested in medicines.  I prescribed Celebrex in clinic today.  She will let me know how she is doing.  Follow-up as needed.  Follow-up: No follow-ups on file.  Subjective:  Chief Complaint  Patient presents with   Leg Pain    Rt leg pain for years states it started with just the leg but now it's getting worse and going into her lower back and groin. Has been seen for this in the past and was told she has arthritis. Previously has had therapy for pain as well that didn't help at all.     History of Present Illness: Brianna Castro is a 66 y.o. female who has been referred by Alvira Monday, FNP for evaluation of right leg pain.  She has had pain in the right leg for several years.  Pain is progressively worsening.  She is not complaining of pain in the right buttock, as well as the groin.  Pain does extend distally.  She has worked with physical therapy in the past without improvement.  She is not currently taking any medications.  She has not had an injection.   Review of Systems: No fevers or chills No numbness or tingling No as hip arthritis No shortness of breath No bowel or bladder  dysfunction No GI distress No headaches   Medical History:  Past Medical History:  Diagnosis Date   Hypertension     Past Surgical History:  Procedure Laterality Date   APPENDECTOMY     KNEE SURGERY     KNEE SURGERY Right    TONSILLECTOMY      Family History  Problem Relation Age of Onset   Stroke Mother    Heart attack Mother    COPD Sister    Stroke Brother    Social History   Tobacco Use   Smoking status: Every Day    Packs/day: 0.50    Years: 40.00    Total pack years: 20.00    Types: Cigarettes   Smokeless tobacco: Never  Substance Use Topics   Alcohol use: No   Drug use: Never    No Known Allergies  Current Meds  Medication Sig   azelastine (ASTELIN) 0.1 % nasal spray Place 2 sprays into both nostrils 2 (two) times daily. Use in each nostril as directed   celecoxib (CELEBREX) 100 MG capsule Take 1 capsule (100 mg total) by mouth 2 (two) times daily.   Vitamin D, Ergocalciferol, (DRISDOL) 1.25 MG (50000 UNIT) CAPS capsule Take 1 capsule (50,000 Units total) by mouth every 7 (seven) days.    Objective: BP (!) 144/88   Pulse 89   Ht 5\' 2"  (1.575 m)   Wt 134  lb (60.8 kg)   BMI 24.51 kg/m   Physical Exam:  General: Alert and oriented. and No acute distress. Gait: Right sided antalgic gait.  Evaluation of the right hip and buttock area demonstrates no deformity.  No swelling is appreciated.  She ambulates with the right foot more externally rotated than the left.  Tolerates minimal internal and external rotation.  Negative straight leg raise, but she is complaining of pain in the hamstrings.  When she lays on her back, she is unable to fully extend the right hip.  Toes are warm and well-perfused.  IMAGING: I personally ordered and reviewed the following images  AP pelvis and right hip x-rays were obtained in clinic today.  No acute injuries are noted.  There are advanced degenerative changes of both hips.  Single AP view of the left hip demonstrates  loss of joint space, with calcification of the labrum, and associated osteophytes on the femoral head.  Within the right hip, there is loss of joint space.  Labrum is also calcified.  There are large osteophytes in the anterior superior aspect of the femoral neck head junction, best visualized on the frog-leg lateral view.  Impression: Right hip arthritis with large osteophytes at the head neck junction.   New Medications:  Meds ordered this encounter  Medications   celecoxib (CELEBREX) 100 MG capsule    Sig: Take 1 capsule (100 mg total) by mouth 2 (two) times daily.    Dispense:  60 capsule    Refill:  0      Mordecai Rasmussen, MD  05/06/2022 5:24 PM

## 2022-05-07 LAB — CBC WITH DIFFERENTIAL/PLATELET
Basophils Absolute: 0.1 10*3/uL (ref 0.0–0.2)
Basos: 1 %
EOS (ABSOLUTE): 0.1 10*3/uL (ref 0.0–0.4)
Eos: 1 %
Hematocrit: 38.1 % (ref 34.0–46.6)
Hemoglobin: 12.6 g/dL (ref 11.1–15.9)
Immature Grans (Abs): 0 10*3/uL (ref 0.0–0.1)
Immature Granulocytes: 0 %
Lymphocytes Absolute: 3.4 10*3/uL — ABNORMAL HIGH (ref 0.7–3.1)
Lymphs: 46 %
MCH: 30.5 pg (ref 26.6–33.0)
MCHC: 33.1 g/dL (ref 31.5–35.7)
MCV: 92 fL (ref 79–97)
Monocytes Absolute: 0.8 10*3/uL (ref 0.1–0.9)
Monocytes: 10 %
Neutrophils Absolute: 3.1 10*3/uL (ref 1.4–7.0)
Neutrophils: 42 %
Platelets: 380 10*3/uL (ref 150–450)
RBC: 4.13 x10E6/uL (ref 3.77–5.28)
RDW: 12.8 % (ref 11.7–15.4)
WBC: 7.4 10*3/uL (ref 3.4–10.8)

## 2022-05-07 LAB — HEMOGLOBIN A1C
Est. average glucose Bld gHb Est-mCnc: 126 mg/dL
Hgb A1c MFr Bld: 6 % — ABNORMAL HIGH (ref 4.8–5.6)

## 2022-05-07 LAB — TSH+FREE T4
Free T4: 1.13 ng/dL (ref 0.82–1.77)
TSH: 0.937 u[IU]/mL (ref 0.450–4.500)

## 2022-05-07 LAB — CMP14+EGFR
ALT: 9 IU/L (ref 0–32)
AST: 12 IU/L (ref 0–40)
Albumin/Globulin Ratio: 1.5 (ref 1.2–2.2)
Albumin: 4.4 g/dL (ref 3.9–4.9)
Alkaline Phosphatase: 75 IU/L (ref 44–121)
BUN/Creatinine Ratio: 15 (ref 12–28)
BUN: 10 mg/dL (ref 8–27)
Bilirubin Total: <0.2 mg/dL (ref 0.0–1.2)
CO2: 22 mmol/L (ref 20–29)
Calcium: 9.7 mg/dL (ref 8.7–10.3)
Chloride: 103 mmol/L (ref 96–106)
Creatinine, Ser: 0.65 mg/dL (ref 0.57–1.00)
Globulin, Total: 2.9 g/dL (ref 1.5–4.5)
Glucose: 78 mg/dL (ref 70–99)
Potassium: 4.9 mmol/L (ref 3.5–5.2)
Sodium: 140 mmol/L (ref 134–144)
Total Protein: 7.3 g/dL (ref 6.0–8.5)
eGFR: 98 mL/min/{1.73_m2} (ref >59)

## 2022-05-07 LAB — LIPID PANEL
Chol/HDL Ratio: 3.6 ratio (ref 0.0–4.4)
Cholesterol, Total: 164 mg/dL (ref 100–199)
HDL: 45 mg/dL (ref >39)
LDL Chol Calc (NIH): 105 mg/dL — ABNORMAL HIGH (ref 0–99)
Triglycerides: 70 mg/dL (ref 0–149)
VLDL Cholesterol Cal: 14 mg/dL (ref 5–40)

## 2022-05-07 LAB — VITAMIN D 25 HYDROXY (VIT D DEFICIENCY, FRACTURES): Vit D, 25-Hydroxy: 30.4 ng/mL (ref 30.0–100.0)

## 2022-05-07 NOTE — Progress Notes (Signed)
Please congratulate the patient on the job well done in decreasing her LDL levels from 148 to 105.  Please encourage her to continue low carbs, low-fat diet.  She is prediabetic and I recommend decreasing her intake of high sugary foods.  All other labs are stable

## 2022-05-29 NOTE — Telephone Encounter (Signed)
Created in error

## 2022-07-16 ENCOUNTER — Other Ambulatory Visit: Payer: Self-pay

## 2022-07-24 ENCOUNTER — Ambulatory Visit: Payer: Medicare Other | Admitting: Family Medicine

## 2022-08-08 ENCOUNTER — Ambulatory Visit (INDEPENDENT_AMBULATORY_CARE_PROVIDER_SITE_OTHER): Payer: Medicare Other | Admitting: Family Medicine

## 2022-08-08 ENCOUNTER — Encounter: Payer: Self-pay | Admitting: Family Medicine

## 2022-08-08 VITALS — BP 132/74 | HR 67 | Ht 62.0 in | Wt 129.8 lb

## 2022-08-08 DIAGNOSIS — R7301 Impaired fasting glucose: Secondary | ICD-10-CM

## 2022-08-08 DIAGNOSIS — Z122 Encounter for screening for malignant neoplasm of respiratory organs: Secondary | ICD-10-CM | POA: Diagnosis not present

## 2022-08-08 DIAGNOSIS — E7849 Other hyperlipidemia: Secondary | ICD-10-CM

## 2022-08-08 DIAGNOSIS — Z720 Tobacco use: Secondary | ICD-10-CM | POA: Diagnosis not present

## 2022-08-08 DIAGNOSIS — E038 Other specified hypothyroidism: Secondary | ICD-10-CM | POA: Diagnosis not present

## 2022-08-08 DIAGNOSIS — M1990 Unspecified osteoarthritis, unspecified site: Secondary | ICD-10-CM | POA: Diagnosis not present

## 2022-08-08 DIAGNOSIS — E559 Vitamin D deficiency, unspecified: Secondary | ICD-10-CM | POA: Diagnosis not present

## 2022-08-08 DIAGNOSIS — E785 Hyperlipidemia, unspecified: Secondary | ICD-10-CM | POA: Insufficient documentation

## 2022-08-08 NOTE — Assessment & Plan Note (Addendum)
She takes rosuvastatin 10 mg daily She reports not continuing therapy after completion of the first 90 days of the medication She reports being unsure if she needs to continue therapy Will assess lipid panel today Encourage patient to continue taking rosuvastatin 10 mg daily Recommend low carbs, saturated and trans fat diet with increased physical activity Lab Results  Component Value Date   CHOL 164 05/06/2022   HDL 45 05/06/2022   LDLCALC 105 (H) 05/06/2022   TRIG 70 05/06/2022   CHOLHDL 3.6 05/06/2022

## 2022-08-08 NOTE — Assessment & Plan Note (Signed)
She smokes a pack of cigarettes daily No intentions of quitting currently Smoking cessation encouraged Education is follow: Smoking is harmful to your health and increases your risk for cancer, COPD, high blood pressure, cataracts, digestive problems, or health problems , such as gum disease, mouth sores, and tooth loss and loss of taste and smell. Smoking irritates your throat and causes coughing.

## 2022-08-08 NOTE — Progress Notes (Signed)
Established Patient Office Visit  Subjective:  Patient ID: Brianna Castro, female    DOB: May 15, 1956  Age: 67 y.o. MRN: [id number]  CC:  Chief Complaint  Patient presents with   Follow-up    3 month f/u    HPI Brianna Castro is a 67 y.o. female with past medical history of tobacco use, and hyperlipidemia presents for f/u of  chronic medical conditions. For the details of today's visit, please refer to the assessment and plan.    Past Medical History:  Diagnosis Date   Hypertension     Past Surgical History:  Procedure Laterality Date   APPENDECTOMY     KNEE SURGERY     KNEE SURGERY Right    TONSILLECTOMY      Family History  Problem Relation Age of Onset   Stroke Mother    Heart attack Mother    COPD Sister    Stroke Brother     Social History   Socioeconomic History   Marital status: Single    Spouse name: Not on file   Number of children: 1   Years of education: Associates Degree   Highest education level: Associate degree: occupational, Hotel manager, or vocational program  Occupational History   Not on file  Tobacco Use   Smoking status: Every Day    Packs/day: 0.50    Years: 40.00    Total pack years: 20.00    Types: Cigarettes   Smokeless tobacco: Never  Vaping Use   Vaping Use: Not on file  Substance and Sexual Activity   Alcohol use: No   Drug use: Never   Sexual activity: Not Currently  Other Topics Concern   Not on file  Social History Narrative   Not on file   Social Determinants of Health   Financial Resource Strain: Low Risk  (01/27/2022)   Overall Financial Resource Strain (CARDIA)    Difficulty of Paying Living Expenses: Not hard at all  Food Insecurity: No Food Insecurity (01/27/2022)   Hunger Vital Sign    Worried About Running Out of Food in the Last Year: Never true    Ran Out of Food in the Last Year: Never true  Transportation Needs: No Transportation Needs (01/27/2022)   PRAPARE - Hydrologist  (Medical): No    Lack of Transportation (Non-Medical): No  Physical Activity: Insufficiently Active (01/27/2022)   Exercise Vital Sign    Days of Exercise per Week: 2 days    Minutes of Exercise per Session: 10 min  Stress: No Stress Concern Present (01/27/2022)   [place]    Feeling of Stress : Not at all  Social Connections: Moderately Integrated (01/27/2022)   Social Connection and Isolation Panel [NHANES]    Frequency of Communication with Friends and Family: More than three times a week    Frequency of Social Gatherings with Friends and Family: More than three times a week    Attends Religious Services: More than 4 times per year    Active Member of [employer] or Organizations: No    Attends Music therapist: 1 to 4 times per year    Marital Status: Never married  Recent Concern: Social Connections - Moderately Isolated (01/27/2022)   Social Connection and Isolation Panel [NHANES]    Frequency of Communication with Friends and Family: More than three times a week    Frequency of Social Gatherings with Friends and Family: More than three times  a week    Attends Religious Services: More than 4 times per year    Active Member of Clubs or Organizations: No    Attends Banker Meetings: Never    Marital Status: Never married  Intimate Partner Violence: Not At Risk (01/27/2022)   Humiliation, Afraid, Rape, and Kick questionnaire    Fear of Current or Ex-Partner: No    Emotionally Abused: No    Physically Abused: No    Sexually Abused: No    Outpatient Medications Prior to Visit  Medication Sig Dispense Refill   azelastine (ASTELIN) 0.1 % nasal spray Place 2 sprays into both nostrils 2 (two) times daily. Use in each nostril as directed 30 mL 12   guaiFENesin (ROBITUSSIN) 100 MG/5ML liquid Take 5 mLs by mouth every 4 (four) hours as needed for cough or to loosen phlegm. (Patient not taking: Reported on  05/06/2022) 120 mL 0   levocetirizine (XYZAL) 5 MG tablet Take 1 tablet (5 mg total) by mouth every evening. (Patient not taking: Reported on 05/06/2022) 20 tablet 0   rosuvastatin (CRESTOR) 10 MG tablet Take 1 tablet (10 mg total) by mouth daily. (Patient not taking: Reported on 04/22/2022) 90 tablet 3   Vitamin D, Ergocalciferol, (DRISDOL) 1.25 MG (50000 UNIT) CAPS capsule Take 1 capsule (50,000 Units total) by mouth every 7 (seven) days. 5 capsule 2   No facility-administered medications prior to visit.    No Known Allergies  ROS Review of Systems  Constitutional:  Negative for chills and fever.  Eyes:  Negative for visual disturbance.  Respiratory:  Negative for chest tightness and shortness of breath.   Neurological:  Negative for dizziness and headaches.      Objective:    Physical Exam HENT:     Head: Normocephalic.     Mouth/Throat:     Mouth: Mucous membranes are moist.  Cardiovascular:     Rate and Rhythm: Normal rate.     Heart sounds: Normal heart sounds.  Pulmonary:     Effort: Pulmonary effort is normal.     Breath sounds: Normal breath sounds.  Neurological:     Mental Status: She is alert.     BP 132/74   Pulse 67   Ht 5\' 2"  (1.575 m)   Wt 129 lb 12.8 oz (58.9 kg)   SpO2 99%   BMI 23.74 kg/m  Wt Readings from Last 3 Encounters:  08/08/22 129 lb 12.8 oz (58.9 kg)  05/06/22 134 lb (60.8 kg)  04/22/22 127 lb 0.6 oz (57.6 kg)    Lab Results  Component Value Date   TSH 0.937 05/06/2022   Lab Results  Component Value Date   WBC 7.4 05/06/2022   HGB 12.6 05/06/2022   HCT 38.1 05/06/2022   MCV 92 05/06/2022   PLT 380 05/06/2022   Lab Results  Component Value Date   NA 140 05/06/2022   K 4.9 05/06/2022   CO2 22 05/06/2022   GLUCOSE 78 05/06/2022   BUN 10 05/06/2022   CREATININE 0.65 05/06/2022   BILITOT <0.2 05/06/2022   ALKPHOS 75 05/06/2022   AST 12 05/06/2022   ALT 9 05/06/2022   PROT 7.3 05/06/2022   ALBUMIN 4.4 05/06/2022    CALCIUM 9.7 05/06/2022   ANIONGAP 9 03/12/2022   EGFR 98 05/06/2022   Lab Results  Component Value Date   CHOL 164 05/06/2022   Lab Results  Component Value Date   HDL 45 05/06/2022   Lab Results  Component Value  Date   LDLCALC 105 (H) 05/06/2022   Lab Results  Component Value Date   TRIG 70 05/06/2022   Lab Results  Component Value Date   CHOLHDL 3.6 05/06/2022   Lab Results  Component Value Date   HGBA1C 6.0 (H) 05/06/2022      Assessment & Plan:  Tobacco use Assessment & Plan: She smokes a pack of cigarettes daily No intentions of quitting currently Smoking cessation encouraged Education is follow: Smoking is harmful to your health and increases your risk for cancer, COPD, high blood pressure, cataracts, digestive problems, or health problems , such as gum disease, mouth sores, and tooth loss and loss of taste and smell. Smoking irritates your throat and causes coughing.    Other hyperlipidemia Assessment & Plan: She takes rosuvastatin 10 mg daily She reports not continuing therapy after completion of the first 90 days of the medication She reports being unsure if she needs to continue therapy Will assess lipid panel today Encourage patient to continue taking rosuvastatin 10 mg daily Recommend low carbs, saturated and trans fat diet with increased physical activity Lab Results  Component Value Date   CHOL 164 05/06/2022   HDL 45 05/06/2022   LDLCALC 105 (H) 05/06/2022   TRIG 70 05/06/2022   CHOLHDL 3.6 05/06/2022     Orders: -     Lipid panel -     CMP14+EGFR -     CBC with Differential/Platelet  Arthritis  Screening for lung cancer -     CT CHEST LUNG CANCER SCREENING LOW DOSE WO CONTRAST  IFG (impaired fasting glucose) -     Hemoglobin A1c  Vitamin D deficiency -     VITAMIN D 25 Hydroxy (Vit-D Deficiency, Fractures)  Other specified hypothyroidism -     TSH + free T4    Follow-up: Return in about 4 months (around 12/07/2022).    Alvira Monday, FNP

## 2022-08-08 NOTE — Patient Instructions (Signed)
I appreciate the opportunity to provide care to you today!    Follow up:  4 months  Labs: please stop by the lab during the week to get your blood drawn (CBC, CMP, TSH, Lipid profile, HgA1c, Vit D)    Please continue to a heart-healthy diet and increase your physical activities. Try to exercise for 55mins at least five times a week.      It was a pleasure to see you and I look forward to continuing to work together on your health and well-being. Please do not hesitate to call the office if you need care or have questions about your care.   Have a wonderful day and week. With Gratitude, Alvira Monday MSN, FNP-BC

## 2022-08-09 LAB — CMP14+EGFR
ALT: 8 IU/L (ref 0–32)
AST: 13 IU/L (ref 0–40)
Albumin/Globulin Ratio: 1.4 (ref 1.2–2.2)
Albumin: 4.4 g/dL (ref 3.9–4.9)
Alkaline Phosphatase: 80 IU/L (ref 44–121)
BUN/Creatinine Ratio: 12 (ref 12–28)
BUN: 9 mg/dL (ref 8–27)
Bilirubin Total: <0.2 mg/dL (ref 0.0–1.2)
CO2: 21 mmol/L (ref 20–29)
Calcium: 9.3 mg/dL (ref 8.7–10.3)
Chloride: 100 mmol/L (ref 96–106)
Creatinine, Ser: 0.74 mg/dL (ref 0.57–1.00)
Globulin, Total: 3.1 g/dL (ref 1.5–4.5)
Glucose: 139 mg/dL — ABNORMAL HIGH (ref 70–99)
Potassium: 3.6 mmol/L (ref 3.5–5.2)
Sodium: 139 mmol/L (ref 134–144)
Total Protein: 7.5 g/dL (ref 6.0–8.5)
eGFR: 90 mL/min/{1.73_m2} (ref >59)

## 2022-08-09 LAB — TSH+FREE T4
Free T4: 1.11 ng/dL (ref 0.82–1.77)
TSH: 0.747 u[IU]/mL (ref 0.450–4.500)

## 2022-08-09 LAB — LIPID PANEL
Chol/HDL Ratio: 5 ratio — ABNORMAL HIGH (ref 0.0–4.4)
Cholesterol, Total: 211 mg/dL — ABNORMAL HIGH (ref 100–199)
HDL: 42 mg/dL (ref >39)
LDL Chol Calc (NIH): 156 mg/dL — ABNORMAL HIGH (ref 0–99)
Triglycerides: 70 mg/dL (ref 0–149)
VLDL Cholesterol Cal: 13 mg/dL (ref 5–40)

## 2022-08-09 LAB — VITAMIN D 25 HYDROXY (VIT D DEFICIENCY, FRACTURES): Vit D, 25-Hydroxy: 18.3 ng/mL — ABNORMAL LOW (ref 30.0–100.0)

## 2022-08-09 LAB — CBC WITH DIFFERENTIAL/PLATELET
Basophils Absolute: 0.1 10*3/uL (ref 0.0–0.2)
Basos: 1 %
EOS (ABSOLUTE): 0.1 10*3/uL (ref 0.0–0.4)
Eos: 1 %
Hematocrit: 38.6 % (ref 34.0–46.6)
Hemoglobin: 12.9 g/dL (ref 11.1–15.9)
Immature Grans (Abs): 0 10*3/uL (ref 0.0–0.1)
Immature Granulocytes: 0 %
Lymphocytes Absolute: 3.9 10*3/uL — ABNORMAL HIGH (ref 0.7–3.1)
Lymphs: 50 %
MCH: 30.9 pg (ref 26.6–33.0)
MCHC: 33.4 g/dL (ref 31.5–35.7)
MCV: 93 fL (ref 79–97)
Monocytes Absolute: 0.6 10*3/uL (ref 0.1–0.9)
Monocytes: 8 %
Neutrophils Absolute: 3.1 10*3/uL (ref 1.4–7.0)
Neutrophils: 40 %
Platelets: 325 10*3/uL (ref 150–450)
RBC: 4.17 x10E6/uL (ref 3.77–5.28)
RDW: 12.9 % (ref 11.7–15.4)
WBC: 7.8 10*3/uL (ref 3.4–10.8)

## 2022-08-09 LAB — HEMOGLOBIN A1C
Est. average glucose Bld gHb Est-mCnc: 128 mg/dL
Hgb A1c MFr Bld: 6.1 % — ABNORMAL HIGH (ref 4.8–5.6)

## 2022-08-23 ENCOUNTER — Other Ambulatory Visit: Payer: Self-pay | Admitting: Family Medicine

## 2022-08-23 DIAGNOSIS — E7849 Other hyperlipidemia: Secondary | ICD-10-CM

## 2022-08-23 DIAGNOSIS — E559 Vitamin D deficiency, unspecified: Secondary | ICD-10-CM

## 2022-08-23 MED ORDER — ROSUVASTATIN CALCIUM 10 MG PO TABS: 10.0000 mg | ORAL_TABLET | Freq: Every day | ORAL | 3 refills | Status: DC

## 2022-08-23 MED ORDER — VITAMIN D (ERGOCALCIFEROL) 1.25 MG (50000 UNIT) PO CAPS: 50000.0000 [IU] | ORAL_CAPSULE | ORAL | 1 refills | Status: DC

## 2022-08-23 NOTE — Progress Notes (Signed)
A weekly vitamin D supplement prescription has been sent to your pharmacy because your vitamin D is low. Your cholesterol levels are elevated. I want your LDL to be less than 100. I recommend avoiding simple carbohydrates including cakes, sweet desserts, ice cream, soda (diet or regular), sweet tea, candies, chips, cookies, store-bought juices, alcohol in excess of 1-2 drinks a day, lemonade, artificial sweeteners, donuts, coffee creamers, and sugar-free products.  I recommend avoiding greasy, fatty foods with increased physical activity. A prescription for rosuvastatin 10 mg has been sent to your pharmacy to help decrease your cholesterol levels. You are prediabetic, I recommend decreasing your intake of foods high in sugar. Your thyroid, kidneys, and liver function are stable.  

## 2022-10-03 ENCOUNTER — Ambulatory Visit (HOSPITAL_COMMUNITY): Payer: Medicare Other

## 2022-10-20 ENCOUNTER — Ambulatory Visit (HOSPITAL_COMMUNITY)
Admission: RE | Admit: 2022-10-20 | Discharge: 2022-10-20 | Disposition: A | Discharge status: Home / Self Care | Payer: Medicare Other | Source: Ambulatory Visit | Attending: Family Medicine | Admitting: Family Medicine

## 2022-10-20 DIAGNOSIS — Z122 Encounter for screening for malignant neoplasm of respiratory organs: Secondary | ICD-10-CM | POA: Diagnosis not present

## 2022-10-20 DIAGNOSIS — F17210 Nicotine dependence, cigarettes, uncomplicated: Secondary | ICD-10-CM | POA: Insufficient documentation

## 2022-10-22 NOTE — Progress Notes (Signed)
Please inform the patient that her lung cancer screening was negative.  Recommended screening is in 1 year

## 2022-12-05 ENCOUNTER — Ambulatory Visit: Payer: Medicare Other | Admitting: Family Medicine

## 2022-12-08 ENCOUNTER — Ambulatory Visit: Payer: Medicare Other | Admitting: Family Medicine

## 2022-12-23 ENCOUNTER — Encounter: Payer: Medicare Other | Admitting: Family Medicine

## 2022-12-24 NOTE — Progress Notes (Signed)
Erroneous encounter-disregard

## 2023-02-02 ENCOUNTER — Encounter: Payer: Medicare Other | Admitting: Family Medicine

## 2023-02-18 ENCOUNTER — Other Ambulatory Visit (HOSPITAL_COMMUNITY): Payer: Self-pay | Admitting: Family Medicine

## 2023-02-18 DIAGNOSIS — Z1231 Encounter for screening mammogram for malignant neoplasm of breast: Secondary | ICD-10-CM

## 2023-02-26 ENCOUNTER — Ambulatory Visit (HOSPITAL_COMMUNITY)
Admission: RE | Admit: 2023-02-26 | Discharge: 2023-02-26 | Disposition: A | Discharge status: Home / Self Care | Payer: Medicare Other | Source: Ambulatory Visit | Attending: Family Medicine | Admitting: Family Medicine

## 2023-02-26 DIAGNOSIS — Z1231 Encounter for screening mammogram for malignant neoplasm of breast: Secondary | ICD-10-CM | POA: Insufficient documentation

## 2023-04-07 ENCOUNTER — Ambulatory Visit (INDEPENDENT_AMBULATORY_CARE_PROVIDER_SITE_OTHER): Payer: Medicare Other | Admitting: Family Medicine

## 2023-04-07 ENCOUNTER — Encounter: Payer: Self-pay | Admitting: Family Medicine

## 2023-04-07 VITALS — BP 159/79 | HR 77 | Temp 98.3°F | Resp 16 | Ht 62.0 in | Wt 132.8 lb

## 2023-04-07 DIAGNOSIS — E559 Vitamin D deficiency, unspecified: Secondary | ICD-10-CM

## 2023-04-07 DIAGNOSIS — E7849 Other hyperlipidemia: Secondary | ICD-10-CM | POA: Diagnosis not present

## 2023-04-07 DIAGNOSIS — J069 Acute upper respiratory infection, unspecified: Secondary | ICD-10-CM

## 2023-04-07 DIAGNOSIS — Z720 Tobacco use: Secondary | ICD-10-CM

## 2023-04-07 MED ORDER — ROSUVASTATIN CALCIUM 10 MG PO TABS
10.0000 mg | ORAL_TABLET | Freq: Every day | ORAL | 3 refills | Status: DC
Start: 2023-04-07 — End: 2023-04-27

## 2023-04-07 MED ORDER — VITAMIN D (ERGOCALCIFEROL) 1.25 MG (50000 UNIT) PO CAPS: 50000.0000 [IU] | ORAL_CAPSULE | ORAL | 1 refills | Status: DC

## 2023-04-07 MED ORDER — VARENICLINE TARTRATE (STARTER) 0.5 MG X 11 & 1 MG X 42 PO TBPK
ORAL_TABLET | ORAL | 0 refills | Status: DC
Start: 2023-04-07 — End: 2023-12-24

## 2023-04-07 MED ORDER — GUAIFENESIN 100 MG/5ML PO LIQD
5.0000 mL | ORAL | 0 refills | Status: AC | PRN
Start: 2023-04-07 — End: ?

## 2023-04-07 MED ORDER — MOLNUPIRAVIR EUA 200MG CAPSULE
4.0000 | ORAL_CAPSULE | Freq: Two times a day (BID) | ORAL | 0 refills | Status: AC
Start: 2023-04-07 — End: 2023-04-12

## 2023-04-07 NOTE — Patient Instructions (Addendum)
I appreciate the opportunity to provide care to you today!    Follow up:  1 week  Viral Upper Respiratory Infection (URI) with Cough: Start taking molnupiravir: Take 4 capsules (800 mg total) by mouth twice daily for 5 days. Take Robitussin 5 mL every 4 hours as needed for cough. Take all medications as prescribed. Additional Recommendations: Increase fluid intake and get plenty of rest. Use Tylenol or ibuprofen as needed for pain, fever, or general discomfort. Perform warm saltwater gargles 3-4 times daily to relieve throat pain or discomfort. Use a humidifier at bedtime to help with cough and nasal congestion. Follow up if your symptoms do not improve  Smoking Cessation:  -I have started you on the Chantix starter pack for smoking cessation. -The prescription has been sent to your pharmacy.  -Please follow the instructions provided on the package     Please continue to a heart-healthy diet and increase your physical activities. Try to exercise for at least five days a week.    It was a pleasure to see you and I look forward to continuing to work together on your health and well-being. Please do not hesitate to call the office if you need care or have questions about your care.  In case of emergency, please visit the Emergency Department for urgent care, or contact our clinic at [phone] to schedule an appointment. We're here to help you!   Have a wonderful day and week. With Gratitude, Gilmore Laroche MSN, FNP-BC

## 2023-04-07 NOTE — Progress Notes (Unsigned)
Established Patient Office Visit  Subjective:  Patient ID: Brianna Castro, female    DOB: 1955-09-19  Age: 67 y.o. MRN: [id number]  CC:  Chief Complaint  Patient presents with   Headache   Sore Throat    Bodyaches, non productive cough, x 3 weeks. Seems to get better one day then worse the next. Has taken OTC sinus pills and tylenol for pain    Cough   Nicotine Dependence    Wants med to help with quitting     HPI Brianna Castro is a 67 y.o. female with past medical history of hyperlipidemia presents for f/u of  chronic medical conditions.  Past Medical History:  Diagnosis Date   Hypertension     Past Surgical History:  Procedure Laterality Date   APPENDECTOMY     KNEE SURGERY     KNEE SURGERY Right    TONSILLECTOMY      Family History  Problem Relation Age of Onset   Stroke Mother    Heart attack Mother    COPD Sister    Stroke Brother     Social History   Socioeconomic History   Marital status: Single    Spouse name: Not on file   Number of children: 1   Years of education: Associates Degree   Highest education level: Associate degree: occupational, Scientist, product/process development, or vocational program  Occupational History   Not on file  Tobacco Use   Smoking status: Every Day    Current packs/day: 0.50    Average packs/day: 0.5 packs/day for 40.0 years (20.0 ttl pk-yrs)    Types: Cigarettes   Smokeless tobacco: Never  Vaping Use   Vaping status: Not on file  Substance and Sexual Activity   Alcohol use: No   Drug use: Never   Sexual activity: Not Currently  Other Topics Concern   Not on file  Social History Narrative   Not on file   Social Determinants of Health   Financial Resource Strain: Low Risk  (01/27/2022)   Overall Financial Resource Strain (CARDIA)    Difficulty of Paying Living Expenses: Not hard at all  Food Insecurity: No Food Insecurity (01/27/2022)   Hunger Vital Sign    Worried About Running Out of Food in the Last Year: Never true    Ran Out of Food  in the Last Year: Never true  Transportation Needs: No Transportation Needs (01/27/2022)   PRAPARE - Administrator, Civil Service (Medical): No    Lack of Transportation (Non-Medical): No  Physical Activity: Insufficiently Active (01/27/2022)   Exercise Vital Sign    Days of Exercise per Week: 2 days    Minutes of Exercise per Session: 10 min  Stress: No Stress Concern Present (01/27/2022)   [employer] of Occupational Health - Occupational Stress Questionnaire    Feeling of Stress : Not at all  Social Connections: Moderately Integrated (01/27/2022)   Social Connection and Isolation Panel [NHANES]    Frequency of Communication with Friends and Family: More than three times a week    Frequency of Social Gatherings with Friends and Family: More than three times a week    Attends Religious Services: More than 4 times per year    Active Member of [employer] or Organizations: No    Attends Banker Meetings: 1 to 4 times per year    Marital Status: Never married  Recent Concern: Social Connections - Moderately Isolated (01/27/2022)   Social Connection and Isolation Panel [NHANES]  Frequency of Communication with Friends and Family: More than three times a week    Frequency of Social Gatherings with Friends and Family: More than three times a week    Attends Religious Services: More than 4 times per year    Active Member of [employer] or Organizations: No    Attends Banker Meetings: Never    Marital Status: Never married  Intimate Partner Violence: Not At Risk (01/27/2022)   Humiliation, Afraid, Rape, and Kick questionnaire    Fear of Current or Ex-Partner: No    Emotionally Abused: No    Physically Abused: No    Sexually Abused: No    Outpatient Medications Prior to Visit  Medication Sig Dispense Refill   rosuvastatin (CRESTOR) 10 MG tablet Take 1 tablet (10 mg total) by mouth daily. (Patient not taking: Reported on 04/07/2023) 90 tablet 3   Vitamin D,  Ergocalciferol, (DRISDOL) 1.25 MG (50000 UNIT) CAPS capsule Take 1 capsule (50,000 Units total) by mouth every 7 (seven) days. (Patient not taking: Reported on 04/07/2023) 10 capsule 1   No facility-administered medications prior to visit.    No Known Allergies  ROS Review of Systems  Constitutional:  Negative for chills and fever.  HENT:  Positive for sore throat.   Eyes:  Negative for visual disturbance.  Respiratory:  Positive for cough. Negative for chest tightness and shortness of breath.   Endocrine: Negative for polyuria.  Neurological:  Positive for headaches. Negative for dizziness.      Objective:    Physical Exam HENT:     Head: Normocephalic.     Mouth/Throat:     Mouth: Mucous membranes are moist.  Cardiovascular:     Rate and Rhythm: Normal rate.     Heart sounds: Normal heart sounds.  Pulmonary:     Effort: Pulmonary effort is normal.     Breath sounds: Normal breath sounds.  Neurological:     Mental Status: She is alert.     BP (!) 159/79   Pulse 77   Temp 98.3 F (36.8 C) (Oral)   Resp 16   Ht 5\' 2"  (1.575 m)   Wt 132 lb 12.8 oz (60.2 kg)   SpO2 95%   BMI 24.29 kg/m  Wt Readings from Last 3 Encounters:  04/07/23 132 lb 12.8 oz (60.2 kg)  08/08/22 129 lb 12.8 oz (58.9 kg)  05/06/22 134 lb (60.8 kg)    Lab Results  Component Value Date   TSH 0.747 08/08/2022   Lab Results  Component Value Date   WBC 7.8 08/08/2022   HGB 12.9 08/08/2022   HCT 38.6 08/08/2022   MCV 93 08/08/2022   PLT 325 08/08/2022   Lab Results  Component Value Date   NA 139 08/08/2022   K 3.6 08/08/2022   CO2 21 08/08/2022   GLUCOSE 139 (H) 08/08/2022   BUN 9 08/08/2022   CREATININE 0.74 08/08/2022   BILITOT <0.2 08/08/2022   ALKPHOS 80 08/08/2022   AST 13 08/08/2022   ALT 8 08/08/2022   PROT 7.5 08/08/2022   ALBUMIN 4.4 08/08/2022   CALCIUM 9.3 08/08/2022   ANIONGAP 9 03/12/2022   EGFR 90 08/08/2022   Lab Results  Component Value Date   CHOL 211 (H)  08/08/2022   Lab Results  Component Value Date   HDL 42 08/08/2022   Lab Results  Component Value Date   LDLCALC 156 (H) 08/08/2022   Lab Results  Component Value Date   TRIG 70 08/08/2022  Lab Results  Component Value Date   CHOLHDL 5.0 (H) 08/08/2022   Lab Results  Component Value Date   HGBA1C 6.1 (H) 08/08/2022      Assessment & Plan:  Other hyperlipidemia Assessment & Plan: She takes rosuvastatin 10 mg daily Recommend low carbs, saturated and trans fat diet with increased physical activity Lab Results  Component Value Date   CHOL 211 (H) 08/08/2022   HDL 42 08/08/2022   LDLCALC 156 (H) 08/08/2022   TRIG 70 08/08/2022   CHOLHDL 5.0 (H) 08/08/2022     Orders: -     Rosuvastatin Calcium; Take 1 tablet (10 mg total) by mouth daily.  Dispense: 90 tablet; Refill: 3  Viral URI with cough Assessment & Plan: Start taking molnupiravir: Take 4 capsules (800 mg total) by mouth twice daily for 5 days. Take Robitussin 5 mL every 4 hours as needed for cough. Take all medications as prescribed. Additional Recommendations: Increase fluid intake and get plenty of rest. Use Tylenol or ibuprofen as needed for pain, fever, or general discomfort. Perform warm saltwater gargles 3-4 times daily to relieve throat pain or discomfort. Use a humidifier at bedtime to help with cough and nasal congestion. Follow up if your symptoms do not improve  Orders: -     molnupiravir EUA; Take 4 capsules (800 mg total) by mouth 2 (two) times daily for 5 days.  Dispense: 40 capsule; Refill: 0 -     guaiFENesin; Take 5 mLs by mouth every 4 (four) hours as needed for cough or to loosen phlegm.  Dispense: 120 mL; Refill: 0  Tobacco use Assessment & Plan: -I have started you on the Chantix starter pack for smoking cessation. -The prescription has been sent to your pharmacy.  -Please follow the instructions provided on the package  Orders: -     Varenicline Tartrate (Starter); Follow the  package directions  Dispense: 53 each; Refill: 0  Vitamin D deficiency -     Vitamin D (Ergocalciferol); Take 1 capsule (50,000 Units total) by mouth every 7 (seven) days.  Dispense: 20 capsule; Refill: 1  Note: This chart has been completed using Engineer, civil (consulting) software, and while attempts have been made to ensure accuracy, certain words and phrases may not be transcribed as intended.    Follow-up: Return in about 1 week (around 04/14/2023).   Gilmore Laroche, FNP

## 2023-04-09 DIAGNOSIS — J069 Acute upper respiratory infection, unspecified: Secondary | ICD-10-CM | POA: Insufficient documentation

## 2023-04-09 NOTE — Assessment & Plan Note (Signed)
Start taking molnupiravir: Take 4 capsules (800 mg total) by mouth twice daily for 5 days. Take Robitussin 5 mL every 4 hours as needed for cough. Take all medications as prescribed. Additional Recommendations: Increase fluid intake and get plenty of rest. Use Tylenol or ibuprofen as needed for pain, fever, or general discomfort. Perform warm saltwater gargles 3-4 times daily to relieve throat pain or discomfort. Use a humidifier at bedtime to help with cough and nasal congestion. Follow up if your symptoms do not improve

## 2023-04-09 NOTE — Assessment & Plan Note (Signed)
-  I have started you on the Chantix starter pack for smoking cessation. -The prescription has been sent to your pharmacy.  -Please follow the instructions provided on the package

## 2023-04-09 NOTE — Assessment & Plan Note (Signed)
She takes rosuvastatin 10 mg daily Recommend low carbs, saturated and trans fat diet with increased physical activity Lab Results  Component Value Date   CHOL 211 (H) 08/08/2022   HDL 42 08/08/2022   LDLCALC 156 (H) 08/08/2022   TRIG 70 08/08/2022   CHOLHDL 5.0 (H) 08/08/2022

## 2023-04-27 ENCOUNTER — Ambulatory Visit (INDEPENDENT_AMBULATORY_CARE_PROVIDER_SITE_OTHER): Payer: Medicare Other | Admitting: Family Medicine

## 2023-04-27 ENCOUNTER — Encounter: Payer: Self-pay | Admitting: Family Medicine

## 2023-04-27 VITALS — BP 138/86 | HR 76 | Ht 62.0 in | Wt 134.1 lb

## 2023-04-27 DIAGNOSIS — J069 Acute upper respiratory infection, unspecified: Secondary | ICD-10-CM | POA: Diagnosis not present

## 2023-04-27 DIAGNOSIS — E7849 Other hyperlipidemia: Secondary | ICD-10-CM

## 2023-04-27 MED ORDER — ROSUVASTATIN CALCIUM 10 MG PO TABS
10.0000 mg | ORAL_TABLET | Freq: Every day | ORAL | 3 refills | Status: DC
Start: 2023-04-27 — End: 2023-12-24

## 2023-04-27 NOTE — Assessment & Plan Note (Signed)
She reports feeling well with no complaints or concerns today

## 2023-04-27 NOTE — Progress Notes (Signed)
Established Patient Office Visit  Subjective:  Patient ID: Brianna Castro, female    DOB: Jul 18, 1956  Age: 67 y.o. MRN: [id number]  CC:  Chief Complaint  Patient presents with   Care Management    1 week f/u    HPI Brianna Castro is a 67 y.o. female presents for f/u of viral upper respiratory infection with cold chronic medical conditions. For the details of today's visit, please refer to the assessment and plan.      Past Medical History:  Diagnosis Date   Hypertension     Past Surgical History:  Procedure Laterality Date   APPENDECTOMY     KNEE SURGERY     KNEE SURGERY Right    TONSILLECTOMY      Family History  Problem Relation Age of Onset   Stroke Mother    Heart attack Mother    COPD Sister    Stroke Brother     Social History   Socioeconomic History   Marital status: Single    Spouse name: Not on file   Number of children: 1   Years of education: Associates Degree   Highest education level: Associate degree: occupational, Scientist, product/process development, or vocational program  Occupational History   Not on file  Tobacco Use   Smoking status: Every Day    Current packs/day: 0.50    Average packs/day: 0.5 packs/day for 40.0 years (20.0 ttl pk-yrs)    Types: Cigarettes   Smokeless tobacco: Never  Vaping Use   Vaping status: Not on file  Substance and Sexual Activity   Alcohol use: No   Drug use: Never   Sexual activity: Not Currently  Other Topics Concern   Not on file  Social History Narrative   Not on file   Social Determinants of Health   Financial Resource Strain: Low Risk  (01/27/2022)   Overall Financial Resource Strain (CARDIA)    Difficulty of Paying Living Expenses: Not hard at all  Food Insecurity: No Food Insecurity (01/27/2022)   Hunger Vital Sign    Worried About Running Out of Food in the Last Year: Never true    Ran Out of Food in the Last Year: Never true  Transportation Needs: No Transportation Needs (01/27/2022)   PRAPARE - Scientist, research (physical sciences) (Medical): No    Lack of Transportation (Non-Medical): No  Physical Activity: Insufficiently Active (01/27/2022)   Exercise Vital Sign    Days of Exercise per Week: 2 days    Minutes of Exercise per Session: 10 min  Stress: No Stress Concern Present (01/27/2022)   [employer] of Occupational Health - Occupational Stress Questionnaire    Feeling of Stress : Not at all  Social Connections: Moderately Integrated (01/27/2022)   Social Connection and Isolation Panel [NHANES]    Frequency of Communication with Friends and Family: More than three times a week    Frequency of Social Gatherings with Friends and Family: More than three times a week    Attends Religious Services: More than 4 times per year    Active Member of [employer] or Organizations: No    Attends Engineer, structural: 1 to 4 times per year    Marital Status: Never married  Recent Concern: Social Connections - Moderately Isolated (01/27/2022)   Social Connection and Isolation Panel [NHANES]    Frequency of Communication with Friends and Family: More than three times a week    Frequency of Social Gatherings with Friends and Family: More than three  times a week    Attends Religious Services: More than 4 times per year    Active Member of Clubs or Organizations: No    Attends Banker Meetings: Never    Marital Status: Never married  Intimate Partner Violence: Not At Risk (01/27/2022)   Humiliation, Afraid, Rape, and Kick questionnaire    Fear of Current or Ex-Partner: No    Emotionally Abused: No    Physically Abused: No    Sexually Abused: No    Outpatient Medications Prior to Visit  Medication Sig Dispense Refill   guaiFENesin (ROBITUSSIN) 100 MG/5ML liquid Take 5 mLs by mouth every 4 (four) hours as needed for cough or to loosen phlegm. 120 mL 0   Varenicline Tartrate, Starter, (CHANTIX STARTING MONTH PAK) 0.5 MG X 11 & 1 MG X 42 TBPK Follow the package directions 53 each 0   Vitamin D,  Ergocalciferol, (DRISDOL) 1.25 MG (50000 UNIT) CAPS capsule Take 1 capsule (50,000 Units total) by mouth every 7 (seven) days. 20 capsule 1   rosuvastatin (CRESTOR) 10 MG tablet Take 1 tablet (10 mg total) by mouth daily. 90 tablet 3   No facility-administered medications prior to visit.    No Known Allergies  ROS Review of Systems  Constitutional:  Negative for chills and fever.  Eyes:  Negative for visual disturbance.  Respiratory:  Negative for chest tightness and shortness of breath.   Neurological:  Negative for dizziness and headaches.      Objective:    Physical Exam HENT:     Head: Normocephalic.     Mouth/Throat:     Mouth: Mucous membranes are moist.  Cardiovascular:     Rate and Rhythm: Normal rate.     Heart sounds: Normal heart sounds.  Pulmonary:     Effort: Pulmonary effort is normal.     Breath sounds: Normal breath sounds.  Neurological:     Mental Status: She is alert.     BP 138/86 (BP Location: Left Arm)   Pulse 76   Ht 5\' 2"  (1.575 m)   Wt 134 lb 1.3 oz (60.8 kg)   SpO2 96%   BMI 24.52 kg/m  Wt Readings from Last 3 Encounters:  04/27/23 134 lb 1.3 oz (60.8 kg)  04/07/23 132 lb 12.8 oz (60.2 kg)  08/08/22 129 lb 12.8 oz (58.9 kg)    Lab Results  Component Value Date   TSH 0.747 08/08/2022   Lab Results  Component Value Date   WBC 7.8 08/08/2022   HGB 12.9 08/08/2022   HCT 38.6 08/08/2022   MCV 93 08/08/2022   PLT 325 08/08/2022   Lab Results  Component Value Date   NA 139 08/08/2022   K 3.6 08/08/2022   CO2 21 08/08/2022   GLUCOSE 139 (H) 08/08/2022   BUN 9 08/08/2022   CREATININE 0.74 08/08/2022   BILITOT <0.2 08/08/2022   ALKPHOS 80 08/08/2022   AST 13 08/08/2022   ALT 8 08/08/2022   PROT 7.5 08/08/2022   ALBUMIN 4.4 08/08/2022   CALCIUM 9.3 08/08/2022   ANIONGAP 9 03/12/2022   EGFR 90 08/08/2022   Lab Results  Component Value Date   CHOL 211 (H) 08/08/2022   Lab Results  Component Value Date   HDL 42 08/08/2022    Lab Results  Component Value Date   LDLCALC 156 (H) 08/08/2022   Lab Results  Component Value Date   TRIG 70 08/08/2022   Lab Results  Component Value Date  CHOLHDL 5.0 (H) 08/08/2022   Lab Results  Component Value Date   HGBA1C 6.1 (H) 08/08/2022      Assessment & Plan:  Viral URI with cough Assessment & Plan: She reports feeling well with no complaints or concerns today   Other hyperlipidemia -     Rosuvastatin Calcium; Take 1 tablet (10 mg total) by mouth daily.  Dispense: 90 tablet; Refill: 3  Note: This chart has been completed using Engineer, civil (consulting) software, and while attempts have been made to ensure accuracy, certain words and phrases may not be transcribed as intended.    Follow-up: Return in about 4 months (around 08/28/2023).   Gilmore Laroche, FNP

## 2023-04-27 NOTE — Patient Instructions (Addendum)
I appreciate the opportunity to provide care to you today!    Follow up:  4 months   Attached with your AVS, you will find valuable resources for self-education. I highly recommend dedicating some time to thoroughly examine them.   Please continue to a heart-healthy diet and increase your physical activities. Try to exercise for at least five days a week.    It was a pleasure to see you and I look forward to continuing to work together on your health and well-being. Please do not hesitate to call the office if you need care or have questions about your care.  In case of emergency, please visit the Emergency Department for urgent care, or contact our clinic at [phone] to schedule an appointment. We're here to help you!   Have a wonderful day and week. With Gratitude, Gilmore Laroche MSN, FNP-BC

## 2023-08-28 ENCOUNTER — Ambulatory Visit: Payer: Medicare Other | Admitting: Family Medicine

## 2023-10-08 ENCOUNTER — Ambulatory Visit: Payer: Self-pay | Admitting: Family Medicine

## 2023-12-24 ENCOUNTER — Telehealth: Admitting: Family Medicine

## 2023-12-24 ENCOUNTER — Encounter: Payer: Self-pay | Admitting: Family Medicine

## 2023-12-24 ENCOUNTER — Ambulatory Visit (INDEPENDENT_AMBULATORY_CARE_PROVIDER_SITE_OTHER): Payer: Self-pay | Admitting: Family Medicine

## 2023-12-24 VITALS — BP 160/80 | HR 71 | Resp 16 | Ht 62.0 in | Wt 138.0 lb

## 2023-12-24 DIAGNOSIS — Z720 Tobacco use: Secondary | ICD-10-CM

## 2023-12-24 DIAGNOSIS — E7849 Other hyperlipidemia: Secondary | ICD-10-CM

## 2023-12-24 DIAGNOSIS — E559 Vitamin D deficiency, unspecified: Secondary | ICD-10-CM

## 2023-12-24 DIAGNOSIS — R7301 Impaired fasting glucose: Secondary | ICD-10-CM | POA: Diagnosis not present

## 2023-12-24 DIAGNOSIS — E038 Other specified hypothyroidism: Secondary | ICD-10-CM | POA: Diagnosis not present

## 2023-12-24 MED ORDER — VARENICLINE TARTRATE 1 MG PO TABS: 1.0000 mg | ORAL_TABLET | Freq: Two times a day (BID) | ORAL | 1 refills | Status: DC

## 2023-12-24 MED ORDER — VITAMIN D (ERGOCALCIFEROL) 1.25 MG (50000 UNIT) PO CAPS
50000.0000 [IU] | ORAL_CAPSULE | ORAL | 1 refills | Status: DC
Start: 2023-12-24 — End: 2024-04-28

## 2023-12-24 MED ORDER — ROSUVASTATIN CALCIUM 10 MG PO TABS: 10.0000 mg | ORAL_TABLET | Freq: Every day | ORAL | 3 refills | Status: DC

## 2023-12-24 NOTE — Assessment & Plan Note (Signed)
 Smokes about 10-12 cig/day  Asked about quitting: confirms that she currently smokes cigarettes Advise to quit smoking: Educated about QUITTING to reduce the risk of cancer, cardio and cerebrovascular disease. Assess willingness: Unwilling to quit at this time, but is working on cutting back. Assist with counseling and pharmacotherapy: Counseled for 5 minutes and literature provided. Arrange for follow up: follow up in 3 months and continue to offer help.

## 2023-12-24 NOTE — Assessment & Plan Note (Signed)
She takes rosuvastatin 10 mg daily Recommend low carbs, saturated and trans fat diet with increased physical activity Lab Results  Component Value Date   CHOL 211 (H) 08/08/2022   HDL 42 08/08/2022   LDLCALC 156 (H) 08/08/2022   TRIG 70 08/08/2022   CHOLHDL 5.0 (H) 08/08/2022

## 2023-12-24 NOTE — Telephone Encounter (Signed)
 Scheduled for nurse visit in 1 week

## 2023-12-24 NOTE — Progress Notes (Signed)
 Established Patient Office Visit  Subjective:  Patient ID: Brianna Castro, female    DOB: 1955/11/25  Age: 68 y.o. MRN: [id number]  CC:  Chief Complaint  Patient presents with   Follow-up    State she needs the chantix  continuing month pack called in.    HPI Brianna Castro is a 68 y.o. female with past medical history of hyperlipidemia, tobacco use presents for f/u of  chronic medical conditions. For the details of today's visit, please refer to the assessment and plan.     Past Medical History:  Diagnosis Date   Hypertension     Past Surgical History:  Procedure Laterality Date   APPENDECTOMY     KNEE SURGERY     KNEE SURGERY Right    TONSILLECTOMY      Family History  Problem Relation Age of Onset   Stroke Mother    Heart attack Mother    COPD Sister    Stroke Brother     Social History   Socioeconomic History   Marital status: Single    Spouse name: Not on file   Number of children: 1   Years of education: Associates Degree   Highest education level: Associate degree: occupational, Scientist, product/process development, or vocational program  Occupational History   Not on file  Tobacco Use   Smoking status: Every Day    Current packs/day: 0.50    Average packs/day: 0.5 packs/day for 40.0 years (20.0 ttl pk-yrs)    Types: Cigarettes   Smokeless tobacco: Never  Vaping Use   Vaping status: Not on file  Substance and Sexual Activity   Alcohol use: No   Drug use: Never   Sexual activity: Not Currently  Other Topics Concern   Not on file  Social History Narrative   Not on file   Social Drivers of Health   Financial Resource Strain: Low Risk  (01/27/2022)   Overall Financial Resource Strain (CARDIA)    Difficulty of Paying Living Expenses: Not hard at all  Food Insecurity: No Food Insecurity (01/27/2022)   Hunger Vital Sign    Worried About Running Out of Food in the Last Year: Never true    Ran Out of Food in the Last Year: Never true  Transportation Needs: No Transportation Needs  (01/27/2022)   PRAPARE - Administrator, Civil Service (Medical): No    Lack of Transportation (Non-Medical): No  Physical Activity: Insufficiently Active (01/27/2022)   Exercise Vital Sign    Days of Exercise per Week: 2 days    Minutes of Exercise per Session: 10 min  Stress: No Stress Concern Present (01/27/2022)   [employer] of Occupational Health - Occupational Stress Questionnaire    Feeling of Stress : Not at all  Social Connections: Moderately Integrated (01/27/2022)   Social Connection and Isolation Panel [NHANES]    Frequency of Communication with Friends and Family: More than three times a week    Frequency of Social Gatherings with Friends and Family: More than three times a week    Attends Religious Services: More than 4 times per year    Active Member of [employer] or Organizations: No    Attends Engineer, structural: 1 to 4 times per year    Marital Status: Never married  Recent Concern: Social Connections - Moderately Isolated (01/27/2022)   Social Connection and Isolation Panel [NHANES]    Frequency of Communication with Friends and Family: More than three times a week    Frequency of Social  Gatherings with Friends and Family: More than three times a week    Attends Religious Services: More than 4 times per year    Active Member of Clubs or Organizations: No    Attends Banker Meetings: Never    Marital Status: Never married  Intimate Partner Violence: Not At Risk (01/27/2022)   Humiliation, Afraid, Rape, and Kick questionnaire    Fear of Current or Ex-Partner: No    Emotionally Abused: No    Physically Abused: No    Sexually Abused: No    Outpatient Medications Prior to Visit  Medication Sig Dispense Refill   rosuvastatin  (CRESTOR ) 10 MG tablet Take 1 tablet (10 mg total) by mouth daily. 90 tablet 3   Varenicline  Tartrate, Starter, (CHANTIX  STARTING MONTH PAK) 0.5 MG X 11 & 1 MG X 42 TBPK Follow the package directions 53 each 0    Vitamin D , Ergocalciferol , (DRISDOL ) 1.25 MG (50000 UNIT) CAPS capsule Take 1 capsule (50,000 Units total) by mouth every 7 (seven) days. 20 capsule 1   guaiFENesin  (ROBITUSSIN) 100 MG/5ML liquid Take 5 mLs by mouth every 4 (four) hours as needed for cough or to loosen phlegm. (Patient not taking: Reported on 12/24/2023) 120 mL 0   No facility-administered medications prior to visit.    No Known Allergies  ROS Review of Systems  Constitutional:  Negative for chills and fever.  Eyes:  Negative for visual disturbance.  Respiratory:  Negative for chest tightness and shortness of breath.   Neurological:  Negative for dizziness and headaches.      Objective:     Physical Exam HENT:     Head: Normocephalic.     Mouth/Throat:     Mouth: Mucous membranes are moist.  Cardiovascular:     Rate and Rhythm: Normal rate.     Heart sounds: Normal heart sounds.  Pulmonary:     Effort: Pulmonary effort is normal.     Breath sounds: Normal breath sounds.  Neurological:     Mental Status: She is alert.     BP (!) 160/80   Pulse 71   Resp 16   Ht 5\' 2"  (1.575 m)   Wt 138 lb (62.6 kg)   SpO2 96%   BMI 25.24 kg/m  Wt Readings from Last 3 Encounters:  12/24/23 138 lb (62.6 kg)  04/27/23 134 lb 1.3 oz (60.8 kg)  04/07/23 132 lb 12.8 oz (60.2 kg)    Lab Results  Component Value Date   TSH 0.747 08/08/2022   Lab Results  Component Value Date   WBC 7.8 08/08/2022   HGB 12.9 08/08/2022   HCT 38.6 08/08/2022   MCV 93 08/08/2022   PLT 325 08/08/2022   Lab Results  Component Value Date   NA 139 08/08/2022   K 3.6 08/08/2022   CO2 21 08/08/2022   GLUCOSE 139 (H) 08/08/2022   BUN 9 08/08/2022   CREATININE 0.74 08/08/2022   BILITOT <0.2 08/08/2022   ALKPHOS 80 08/08/2022   AST 13 08/08/2022   ALT 8 08/08/2022   PROT 7.5 08/08/2022   ALBUMIN 4.4 08/08/2022   CALCIUM  9.3 08/08/2022   ANIONGAP 9 03/12/2022   EGFR 90 08/08/2022   Lab Results  Component Value Date   CHOL 211  (H) 08/08/2022   Lab Results  Component Value Date   HDL 42 08/08/2022   Lab Results  Component Value Date   LDLCALC 156 (H) 08/08/2022   Lab Results  Component Value Date   TRIG  70 08/08/2022   Lab Results  Component Value Date   CHOLHDL 5.0 (H) 08/08/2022   Lab Results  Component Value Date   HGBA1C 6.1 (H) 08/08/2022      Assessment & Plan:  Other hyperlipidemia Assessment & Plan: She takes rosuvastatin  10 mg daily Recommend low carbs, saturated and trans fat diet with increased physical activity Lab Results  Component Value Date   CHOL 211 (H) 08/08/2022   HDL 42 08/08/2022   LDLCALC 156 (H) 08/08/2022   TRIG 70 08/08/2022   CHOLHDL 5.0 (H) 08/08/2022     Orders: -     Lipid panel -     CMP14+EGFR -     CBC with Differential/Platelet -     Rosuvastatin  Calcium ; Take 1 tablet (10 mg total) by mouth daily.  Dispense: 90 tablet; Refill: 3  Tobacco use Assessment & Plan: Smokes about 10-12 cig/day  Asked about quitting: confirms that she currently smokes cigarettes Advise to quit smoking: Educated about QUITTING to reduce the risk of cancer, cardio and cerebrovascular disease. Assess willingness: Unwilling to quit at this time, but is working on cutting back. Assist with counseling and pharmacotherapy: Counseled for 5 minutes and literature provided. Arrange for follow up: follow up in 3 months and continue to offer help.    Orders: -     Varenicline  Tartrate; Take 1 tablet (1 mg total) by mouth 2 (two) times daily.  Dispense: 84 tablet; Refill: 1  IFG (impaired fasting glucose) -     Hemoglobin A1c  Vitamin D  deficiency -     VITAMIN D  25 Hydroxy (Vit-D Deficiency, Fractures) -     Vitamin D  (Ergocalciferol ); Take 1 capsule (50,000 Units total) by mouth every 7 (seven) days.  Dispense: 30 capsule; Refill: 1  TSH (thyroid -stimulating hormone deficiency) -     TSH + free T4  Note: This chart has been completed using Engineer, civil (consulting) software,  and while attempts have been made to ensure accuracy, certain words and phrases may not be transcribed as intended.    Follow-up: Return in about 4 months (around 04/24/2024).   Deaun Rocha, FNP

## 2023-12-24 NOTE — Patient Instructions (Addendum)
 I appreciate the opportunity to provide care to you today!    Follow up:  4 months  Labs: please stop by the lab today to get your blood drawn (CBC, CMP, TSH, Lipid profile, HgA1c, Vit D)  For a Healthier YOU, I Recommend: Reducing your intake of sugar, sodium, carbohydrates, and saturated fats. Increasing your fiber intake by incorporating more whole grains, fruits, and vegetables into your meals. Setting healthy goals with a focus on lowering your consumption of carbs, sugar, and unhealthy fats. Adding variety to your diet by including a wide range of fruits and vegetables. Cutting back on soda and limiting processed foods as much as possible. Staying active: In addition to taking your weight loss medication, aim for at least 150 minutes of moderate-intensity physical activity each week for optimal results.    Please follow up if your symptoms worsen or fail to improve.    Please continue to a heart-healthy diet and increase your physical activities. Try to exercise for at least five days a week.    It was a pleasure to see you and I look forward to continuing to work together on your health and well-being. Please do not hesitate to call the office if you need care or have questions about your care.  In case of emergency, please visit the Emergency Department for urgent care, or contact our clinic at [phone] to schedule an appointment. We're here to help you!   Have a wonderful day and week. With Gratitude, Aerilyn Slee MSN, FNP-BC

## 2023-12-25 LAB — LIPID PANEL

## 2023-12-30 LAB — CBC WITH DIFFERENTIAL/PLATELET
Basophils Absolute: 0 10*3/uL (ref 0.0–0.2)
Basos: 1 %
EOS (ABSOLUTE): 0.1 10*3/uL (ref 0.0–0.4)
Eos: 1 %
Hematocrit: 39 % (ref 34.0–46.6)
Hemoglobin: 12.6 g/dL (ref 11.1–15.9)
Immature Grans (Abs): 0 10*3/uL (ref 0.0–0.1)
Immature Granulocytes: 0 %
Lymphocytes Absolute: 3.4 10*3/uL — ABNORMAL HIGH (ref 0.7–3.1)
Lymphs: 44 %
MCH: 30.9 pg (ref 26.6–33.0)
MCHC: 32.3 g/dL (ref 31.5–35.7)
MCV: 96 fL (ref 79–97)
Monocytes Absolute: 0.6 10*3/uL (ref 0.1–0.9)
Monocytes: 7 %
Neutrophils Absolute: 3.6 10*3/uL (ref 1.4–7.0)
Neutrophils: 47 %
Platelets: 326 10*3/uL (ref 150–450)
RBC: 4.08 x10E6/uL (ref 3.77–5.28)
RDW: 13.4 % (ref 11.7–15.4)
WBC: 7.8 10*3/uL (ref 3.4–10.8)

## 2023-12-30 LAB — CMP14+EGFR
ALT: 12 IU/L (ref 0–32)
AST: 16 IU/L (ref 0–40)
Albumin: 4.2 g/dL (ref 3.9–4.9)
Alkaline Phosphatase: 89 IU/L (ref 44–121)
BUN/Creatinine Ratio: 10 — ABNORMAL LOW (ref 12–28)
BUN: 6 mg/dL — ABNORMAL LOW (ref 8–27)
Bilirubin Total: <0.2 mg/dL (ref 0.0–1.2)
CO2: 21 mmol/L (ref 20–29)
Calcium: 9.5 mg/dL (ref 8.7–10.3)
Chloride: 103 mmol/L (ref 96–106)
Creatinine, Ser: 0.6 mg/dL (ref 0.57–1.00)
Globulin, Total: 2.8 g/dL (ref 1.5–4.5)
Glucose: 92 mg/dL (ref 70–99)
Potassium: 3.9 mmol/L (ref 3.5–5.2)
Sodium: 140 mmol/L (ref 134–144)
Total Protein: 7 g/dL (ref 6.0–8.5)
eGFR: 98 mL/min/{1.73_m2} (ref >59)

## 2023-12-30 LAB — LIPID PANEL
Chol/HDL Ratio: 4.6 ratio — ABNORMAL HIGH (ref 0.0–4.4)
Cholesterol, Total: 195 mg/dL (ref 100–199)
HDL: 42 mg/dL (ref >39)
LDL Chol Calc (NIH): 133 mg/dL — ABNORMAL HIGH (ref 0–99)
Triglycerides: 111 mg/dL (ref 0–149)
VLDL Cholesterol Cal: 20 mg/dL (ref 5–40)

## 2023-12-30 LAB — TSH+FREE T4
Free T4: 1.06 ng/dL (ref 0.82–1.77)
TSH: 0.645 u[IU]/mL (ref 0.450–4.500)

## 2023-12-30 LAB — VITAMIN D 25 HYDROXY (VIT D DEFICIENCY, FRACTURES): Vit D, 25-Hydroxy: 22.4 ng/mL — ABNORMAL LOW (ref 30.0–100.0)

## 2023-12-30 LAB — HEMOGLOBIN A1C
Est. average glucose Bld gHb Est-mCnc: 123 mg/dL
Hgb A1c MFr Bld: 5.9 % — ABNORMAL HIGH (ref 4.8–5.6)

## 2024-01-01 ENCOUNTER — Ambulatory Visit

## 2024-01-08 ENCOUNTER — Ambulatory Visit

## 2024-01-08 NOTE — Progress Notes (Signed)
 Patient is in office today for a nurse visit for Blood Pressure Check 154/67 in left arm, had patient sit and wait five minutes then rechecked. 155/72 in right arm.

## 2024-01-13 ENCOUNTER — Ambulatory Visit: Payer: Self-pay | Admitting: Family Medicine

## 2024-01-13 DIAGNOSIS — E7849 Other hyperlipidemia: Secondary | ICD-10-CM

## 2024-01-13 MED ORDER — ROSUVASTATIN CALCIUM 20 MG PO TABS: 20.0000 mg | ORAL_TABLET | Freq: Every day | ORAL | 3 refills | Status: DC

## 2024-04-28 ENCOUNTER — Encounter: Payer: Self-pay | Admitting: Family Medicine

## 2024-04-28 ENCOUNTER — Ambulatory Visit: Payer: Self-pay | Admitting: Family Medicine

## 2024-04-28 VITALS — BP 136/69 | HR 82 | Resp 18 | Ht 62.0 in | Wt 137.1 lb

## 2024-04-28 DIAGNOSIS — Z122 Encounter for screening for malignant neoplasm of respiratory organs: Secondary | ICD-10-CM | POA: Diagnosis not present

## 2024-04-28 DIAGNOSIS — E7849 Other hyperlipidemia: Secondary | ICD-10-CM | POA: Diagnosis not present

## 2024-04-28 DIAGNOSIS — E038 Other specified hypothyroidism: Secondary | ICD-10-CM | POA: Diagnosis not present

## 2024-04-28 DIAGNOSIS — Z720 Tobacco use: Secondary | ICD-10-CM

## 2024-04-28 DIAGNOSIS — E559 Vitamin D deficiency, unspecified: Secondary | ICD-10-CM | POA: Diagnosis not present

## 2024-04-28 DIAGNOSIS — Z1231 Encounter for screening mammogram for malignant neoplasm of breast: Secondary | ICD-10-CM

## 2024-04-28 MED ORDER — VARENICLINE TARTRATE 1 MG PO TABS: 1.0000 mg | ORAL_TABLET | Freq: Two times a day (BID) | ORAL | 1 refills | Status: AC

## 2024-04-28 MED ORDER — VITAMIN D (ERGOCALCIFEROL) 1.25 MG (50000 UNIT) PO CAPS: 50000.0000 [IU] | ORAL_CAPSULE | ORAL | 1 refills | Status: AC

## 2024-04-28 MED ORDER — ROSUVASTATIN CALCIUM 20 MG PO TABS: 20.0000 mg | ORAL_TABLET | Freq: Every day | ORAL | 3 refills | Status: AC

## 2024-04-28 NOTE — Assessment & Plan Note (Signed)
 She takes rosuvastatin  20 mg daily Recommend low carbs, saturated and trans fat diet with increased physical activity Lab Results  Component Value Date   CHOL 195 12/24/2023   HDL 42 12/24/2023   LDLCALC 133 (H) 12/24/2023   TRIG 111 12/24/2023   CHOLHDL 4.6 (H) 12/24/2023

## 2024-04-28 NOTE — Progress Notes (Signed)
 Established Patient Office Visit  Subjective:  Patient ID: Brianna Castro, female    DOB: [birth year]  Age: 68 y.o. MRN: [id number]  CC:  Chief Complaint  Patient presents with   Hyperlipidemia    4 month follow up     HPI Brianna Castro is a 68 y.o. female with past medical history of  hyperlipidemia presents for f/u of  chronic medical conditions. For the details of today's visit, please refer to the assessment and plan.     Past Medical History:  Diagnosis Date   Hypertension     Past Surgical History:  Procedure Laterality Date   APPENDECTOMY     KNEE SURGERY     KNEE SURGERY Right    TONSILLECTOMY      Family History  Problem Relation Age of Onset   Stroke Mother    Heart attack Mother    COPD Sister    Stroke Brother     Social History   Socioeconomic History   Marital status: Single    Spouse name: Not on file   Number of children: 1   Years of education: Associates Degree   Highest education level: Associate degree: occupational, Scientist, product/process development, or vocational program  Occupational History   Not on file  Tobacco Use   Smoking status: Every Day    Current packs/day: 0.50    Average packs/day: 0.5 packs/day for 40.0 years (20.0 ttl pk-yrs)    Types: Cigarettes   Smokeless tobacco: Never  Vaping Use   Vaping status: Not on file  Substance and Sexual Activity   Alcohol use: No   Drug use: Never   Sexual activity: Not Currently  Other Topics Concern   Not on file  Social History Narrative   Not on file   Social Drivers of Health   Financial Resource Strain: Low Risk  (01/27/2022)   Overall Financial Resource Strain (CARDIA)    Difficulty of Paying Living Expenses: Not hard at all  Food Insecurity: No Food Insecurity (01/27/2022)   Hunger Vital Sign    Worried About Running Out of Food in the Last Year: Never true    Ran Out of Food in the Last Year: Never true  Transportation Needs: No Transportation Needs (01/27/2022)   PRAPARE - Doctor, general practice (Medical): No    Lack of Transportation (Non-Medical): No  Physical Activity: Insufficiently Active (01/27/2022)   Exercise Vital Sign    Days of Exercise per Week: 2 days    Minutes of Exercise per Session: 10 min  Stress: No Stress Concern Present (01/27/2022)   [employer] of Occupational Health - Occupational Stress Questionnaire    Feeling of Stress : Not at all  Social Connections: Moderately Integrated (01/27/2022)   Social Connection and Isolation Panel    Frequency of Communication with Friends and Family: More than three times a week    Frequency of Social Gatherings with Friends and Family: More than three times a week    Attends Religious Services: More than 4 times per year    Active Member of [employer] or Organizations: No    Attends Engineer, structural: 1 to 4 times per year    Marital Status: Never married  Recent Concern: Social Connections - Moderately Isolated (01/27/2022)   Social Connection and Isolation Panel    Frequency of Communication with Friends and Family: More than three times a week    Frequency of Social Gatherings with Friends and Family: More than three  times a week    Attends Religious Services: More than 4 times per year    Active Member of Clubs or Organizations: No    Attends Banker Meetings: Never    Marital Status: Never married  Intimate Partner Violence: Not At Risk (01/27/2022)   Humiliation, Afraid, Rape, and Kick questionnaire    Fear of Current or Ex-Partner: No    Emotionally Abused: No    Physically Abused: No    Sexually Abused: No    Outpatient Medications Prior to Visit  Medication Sig Dispense Refill   guaiFENesin  (ROBITUSSIN) 100 MG/5ML liquid Take 5 mLs by mouth every 4 (four) hours as needed for cough or to loosen phlegm. (Patient not taking: Reported on 04/28/2024) 120 mL 0   rosuvastatin  (CRESTOR ) 20 MG tablet Take 1 tablet (20 mg total) by mouth daily. (Patient not taking: Reported on  04/28/2024) 90 tablet 3   varenicline  (CHANTIX  CONTINUING MONTH PAK) 1 MG tablet Take 1 tablet (1 mg total) by mouth 2 (two) times daily. (Patient not taking: Reported on 04/28/2024) 84 tablet 1   Vitamin D , Ergocalciferol , (DRISDOL ) 1.25 MG (50000 UNIT) CAPS capsule Take 1 capsule (50,000 Units total) by mouth every 7 (seven) days. (Patient not taking: Reported on 04/28/2024) 30 capsule 1   No facility-administered medications prior to visit.    No Known Allergies  ROS Review of Systems  Constitutional:  Negative for chills and fever.  Eyes:  Negative for visual disturbance.  Respiratory:  Negative for chest tightness and shortness of breath.   Neurological:  Negative for dizziness and headaches.      Objective:    Physical Exam HENT:     Head: Normocephalic.     Mouth/Throat:     Mouth: Mucous membranes are moist.  Cardiovascular:     Rate and Rhythm: Normal rate.     Heart sounds: Normal heart sounds.  Pulmonary:     Effort: Pulmonary effort is normal.     Breath sounds: Normal breath sounds.  Neurological:     Mental Status: She is alert.     BP 136/69   Pulse 82   Resp 18   Ht 5' 2 (1.575 m)   Wt 137 lb 1.3 oz (62.2 kg)   SpO2 98%   BMI 25.07 kg/m  Wt Readings from Last 3 Encounters:  04/28/24 137 lb 1.3 oz (62.2 kg)  12/24/23 138 lb (62.6 kg)  04/27/23 134 lb 1.3 oz (60.8 kg)    Lab Results  Component Value Date   TSH 0.645 12/24/2023   Lab Results  Component Value Date   WBC 7.8 12/24/2023   HGB 12.6 12/24/2023   HCT 39.0 12/24/2023   MCV 96 12/24/2023   PLT 326 12/24/2023   Lab Results  Component Value Date   NA 140 12/24/2023   K 3.9 12/24/2023   CO2 21 12/24/2023   GLUCOSE 92 12/24/2023   BUN 6 (L) 12/24/2023   CREATININE 0.60 12/24/2023   BILITOT <0.2 12/24/2023   ALKPHOS 89 12/24/2023   AST 16 12/24/2023   ALT 12 12/24/2023   PROT 7.0 12/24/2023   ALBUMIN 4.2 12/24/2023   CALCIUM  9.5 12/24/2023   ANIONGAP 9 03/12/2022   EGFR 98  12/24/2023   Lab Results  Component Value Date   CHOL 195 12/24/2023   Lab Results  Component Value Date   HDL 42 12/24/2023   Lab Results  Component Value Date   LDLCALC 133 (H) 12/24/2023   Lab  Results  Component Value Date   TRIG 111 12/24/2023   Lab Results  Component Value Date   CHOLHDL 4.6 (H) 12/24/2023   Lab Results  Component Value Date   HGBA1C 5.9 (H) 12/24/2023      Assessment & Plan:  Other hyperlipidemia Assessment & Plan: She takes rosuvastatin  20 mg daily Recommend low carbs, saturated and trans fat diet with increased physical activity Lab Results  Component Value Date   CHOL 195 12/24/2023   HDL 42 12/24/2023   LDLCALC 133 (H) 12/24/2023   TRIG 111 12/24/2023   CHOLHDL 4.6 (H) 12/24/2023     Orders: -     Rosuvastatin  Calcium ; Take 1 tablet (20 mg total) by mouth daily.  Dispense: 90 tablet; Refill: 3 -     Hemoglobin A1c -     Lipid panel -     CMP14+EGFR -     CBC with Differential/Platelet  Tobacco use Assessment & Plan: I recommend smoking cessation as a critical step for improving your overall health. Smoking significantly increases your risk for a range of serious health issues, including cancer, chronic obstructive pulmonary disease (COPD), high blood pressure, cataracts, and various digestive problems. Additionally, smoking can lead to oral health issues such as gum disease, mouth sores, tooth loss, and diminished taste and smell. It also irritates the throat and contributes to persistent coughing. Quitting smoking can greatly reduce these risks and improve your quality of life.      Orders: -     Varenicline  Tartrate; Take 1 tablet (1 mg total) by mouth 2 (two) times daily.  Dispense: 84 tablet; Refill: 1 -     Ambulatory Referral for Lung Cancer Scre  Vitamin D  deficiency -     Vitamin D  (Ergocalciferol ); Take 1 capsule (50,000 Units total) by mouth every 7 (seven) days.  Dispense: 30 capsule; Refill: 1 -     VITAMIN D  25  Hydroxy (Vit-D Deficiency, Fractures)  Screening for lung cancer -     Ambulatory Referral for Lung Cancer Scre  Breast cancer screening by mammogram -     3D Screening Mammogram, Left and Right  TSH (thyroid -stimulating hormone deficiency) -     TSH + free T4  Note: This chart has been completed using Engineer, civil (consulting) software, and while attempts have been made to ensure accuracy, certain words and phrases may not be transcribed as intended.    Follow-up: Return in about 5 months (around 09/26/2024).   Kallan Bischoff  Z Bacchus, FNP

## 2024-04-28 NOTE — Assessment & Plan Note (Signed)
 I recommend smoking cessation as a critical step for improving your overall health. Smoking significantly increases your risk for a range of serious health issues, including cancer, chronic obstructive pulmonary disease (COPD), high blood pressure, cataracts, and various digestive problems. Additionally, smoking can lead to oral health issues such as gum disease, mouth sores, tooth loss, and diminished taste and smell. It also irritates the throat and contributes to persistent coughing. Quitting smoking can greatly reduce these risks and improve your quality of life.

## 2024-04-28 NOTE — Patient Instructions (Addendum)
 I appreciate the opportunity to provide care to you today!    Follow up:  5 months  Labs: please stop by the lab today to get your blood drawn (CBC, CMP, TSH, Lipid profile, HgA1c, Vit D)  Schedule mammogram and medicare annual wellness visit   For a Healthier YOU, I Recommend: Reducing your intake of sugar, sodium, carbohydrates, and saturated fats. Increasing your fiber intake by incorporating more whole grains, fruits, and vegetables into your meals. Setting healthy goals with a focus on lowering your consumption of carbs, sugar, and unhealthy fats. Adding variety to your diet by including a wide range of fruits and vegetables. Cutting back on soda and limiting processed foods as much as possible. Staying active: In addition to taking your weight loss medication, aim for at least 150 minutes of moderate-intensity physical activity each week for optimal results.    Please follow up if your symptoms worsen or fail to improve.   Referrals today-  pulmonary for lung cancer screening    Please continue to a heart-healthy diet and increase your physical activities. Try to exercise for at least five days a week.    It was a pleasure to see you and I look forward to continuing to work together on your health and well-being. Please do not hesitate to call the office if you need care or have questions about your care.  In case of emergency, please visit the Emergency Department for urgent care, or contact our clinic at [phone] to schedule an appointment. We're here to help you!   Have a wonderful day and week. With Gratitude, Meade JENEANE Gerlach MSN, FNP-BC, PMHNP-BC

## 2024-04-29 ENCOUNTER — Ambulatory Visit: Payer: Self-pay | Admitting: Family Medicine

## 2024-04-29 LAB — CBC WITH DIFFERENTIAL/PLATELET
Basophils Absolute: 0 x10E3/uL (ref 0.0–0.2)
Basos: 0 %
EOS (ABSOLUTE): 0.1 x10E3/uL (ref 0.0–0.4)
Eos: 1 %
Hematocrit: 39.1 % (ref 34.0–46.6)
Hemoglobin: 12.4 g/dL (ref 11.1–15.9)
Immature Grans (Abs): 0 x10E3/uL (ref 0.0–0.1)
Immature Granulocytes: 0 %
Lymphocytes Absolute: 2.6 x10E3/uL (ref 0.7–3.1)
Lymphs: 37 %
MCH: 29.7 pg (ref 26.6–33.0)
MCHC: 31.7 g/dL (ref 31.5–35.7)
MCV: 94 fL (ref 79–97)
Monocytes Absolute: 0.7 x10E3/uL (ref 0.1–0.9)
Monocytes: 10 %
Neutrophils Absolute: 3.7 x10E3/uL (ref 1.4–7.0)
Neutrophils: 52 %
Platelets: 355 x10E3/uL (ref 150–450)
RBC: 4.17 x10E6/uL (ref 3.77–5.28)
RDW: 13.5 % (ref 11.7–15.4)
WBC: 7.2 x10E3/uL (ref 3.4–10.8)

## 2024-04-29 LAB — TSH+FREE T4
Free T4: 1.13 ng/dL (ref 0.82–1.77)
TSH: 0.679 u[IU]/mL (ref 0.450–4.500)

## 2024-04-29 LAB — CMP14+EGFR
ALT: 8 IU/L (ref 0–32)
AST: 15 IU/L (ref 0–40)
Albumin: 4.1 g/dL (ref 3.9–4.9)
Alkaline Phosphatase: 90 IU/L (ref 49–135)
BUN/Creatinine Ratio: 9 — ABNORMAL LOW (ref 12–28)
BUN: 6 mg/dL — ABNORMAL LOW (ref 8–27)
Bilirubin Total: 0.2 mg/dL (ref 0.0–1.2)
CO2: 21 mmol/L (ref 20–29)
Calcium: 9.5 mg/dL (ref 8.7–10.3)
Chloride: 102 mmol/L (ref 96–106)
Creatinine, Ser: 0.68 mg/dL (ref 0.57–1.00)
Globulin, Total: 3 g/dL (ref 1.5–4.5)
Glucose: 88 mg/dL (ref 70–99)
Potassium: 4.5 mmol/L (ref 3.5–5.2)
Sodium: 139 mmol/L (ref 134–144)
Total Protein: 7.1 g/dL (ref 6.0–8.5)
eGFR: 95 mL/min/1.73 (ref >59)

## 2024-04-29 LAB — LIPID PANEL
Chol/HDL Ratio: 5 ratio — ABNORMAL HIGH (ref 0.0–4.4)
Cholesterol, Total: 191 mg/dL (ref 100–199)
HDL: 38 mg/dL — ABNORMAL LOW (ref >39)
LDL Chol Calc (NIH): 136 mg/dL — ABNORMAL HIGH (ref 0–99)
Triglycerides: 92 mg/dL (ref 0–149)
VLDL Cholesterol Cal: 17 mg/dL (ref 5–40)

## 2024-04-29 LAB — HEMOGLOBIN A1C
Est. average glucose Bld gHb Est-mCnc: 128 mg/dL
Hgb A1c MFr Bld: 6.1 % — ABNORMAL HIGH (ref 4.8–5.6)

## 2024-04-29 LAB — VITAMIN D 25 HYDROXY (VIT D DEFICIENCY, FRACTURES): Vit D, 25-Hydroxy: 20 ng/mL — ABNORMAL LOW (ref 30.0–100.0)

## 2024-09-26 ENCOUNTER — Ambulatory Visit: Admitting: Family Medicine
